# Patient Record
Sex: Female | Born: 1955 | Race: White | Hispanic: No | Smoking: Never smoker
Health system: Southern US, Community
[De-identification: ages and names within clinical notes are randomized; demographics above are authoritative.]

## PROBLEM LIST (undated history)

## (undated) DIAGNOSIS — R7303 Prediabetes: Secondary | ICD-10-CM

## (undated) DIAGNOSIS — T7840XA Allergy, unspecified, initial encounter: Secondary | ICD-10-CM

## (undated) DIAGNOSIS — E669 Obesity, unspecified: Secondary | ICD-10-CM

## (undated) DIAGNOSIS — Z923 Personal history of irradiation: Secondary | ICD-10-CM

## (undated) DIAGNOSIS — R519 Headache, unspecified: Secondary | ICD-10-CM

## (undated) HISTORY — DX: Allergy, unspecified, initial encounter: T78.40XA

## (undated) HISTORY — DX: Personal history of irradiation: Z92.3

## (undated) HISTORY — PX: LYMPH NODE BIOPSY: SHX201

## (undated) HISTORY — PX: COLON SURGERY: SHX602

## (undated) HISTORY — PX: FRACTURE SURGERY: SHX138

## (undated) HISTORY — DX: Obesity, unspecified: E66.9

## (undated) HISTORY — PX: TONSILLECTOMY: SUR1361

## (undated) HISTORY — PX: BUNIONECTOMY: SHX129

---

## 2000-02-12 ENCOUNTER — Encounter: Payer: Self-pay | Admitting: Family Medicine

## 2000-02-12 ENCOUNTER — Ambulatory Visit (HOSPITAL_COMMUNITY): Admission: RE | Admit: 2000-02-12 | Discharge: 2000-02-12 | Payer: Self-pay | Admitting: Family Medicine

## 2000-07-11 ENCOUNTER — Encounter: Payer: Self-pay | Admitting: Otolaryngology

## 2000-07-11 ENCOUNTER — Encounter: Admission: RE | Admit: 2000-07-11 | Discharge: 2000-07-11 | Payer: Self-pay | Admitting: Otolaryngology

## 2001-01-31 ENCOUNTER — Other Ambulatory Visit: Admission: RE | Admit: 2001-01-31 | Discharge: 2001-01-31 | Payer: Self-pay | Admitting: Obstetrics and Gynecology

## 2004-07-25 ENCOUNTER — Ambulatory Visit (HOSPITAL_COMMUNITY): Admission: RE | Admit: 2004-07-25 | Discharge: 2004-07-25 | Payer: Self-pay | Admitting: Family Medicine

## 2013-05-22 ENCOUNTER — Encounter: Payer: Self-pay | Admitting: Family Medicine

## 2013-05-22 ENCOUNTER — Ambulatory Visit (INDEPENDENT_AMBULATORY_CARE_PROVIDER_SITE_OTHER): Payer: 59 | Admitting: Family Medicine

## 2013-05-22 ENCOUNTER — Encounter (INDEPENDENT_AMBULATORY_CARE_PROVIDER_SITE_OTHER): Payer: Self-pay

## 2013-05-22 VITALS — BP 117/70 | HR 81 | Temp 98.2°F | Ht 65.0 in | Wt 188.0 lb

## 2013-05-22 DIAGNOSIS — M79609 Pain in unspecified limb: Secondary | ICD-10-CM

## 2013-05-22 DIAGNOSIS — B070 Plantar wart: Secondary | ICD-10-CM

## 2013-05-22 NOTE — Progress Notes (Signed)
   Subjective:    Patient ID: Haley Luna, female    DOB: 07-Apr-1956, 58 y.o.   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  Has had on present for past 1-2 months.  Progressively painful.  No fevers, chills, erythema    Review of Systems  All other systems reviewed and are negative.       Objective:   Physical Exam  Constitutional: She appears well-developed and well-nourished.  HENT:  Head: Normocephalic and atraumatic.  Eyes: Conjunctivae are normal. Pupils are equal, round, and reactive to light.  Neck: Normal range of motion. Neck supple.  Cardiovascular: Normal rate and regular rhythm.   Pulmonary/Chest: Effort normal and breath sounds normal.  Abdominal: Soft.  Neurological: She is alert.  Skin:  + plantar at base of 3rd toe           Assessment & Plan:  Plantar wart of right foot  Liquid nitrogen therapy to affected area.  Discussed general healing course and red flags.  Pt expressed understanding.  Follow up as needed.

## 2013-05-22 NOTE — Patient Instructions (Signed)
Plantar Wart Warts are benign (noncancerous) growths of the outer skin layer. They can occur at any time in life but are most common during childhood and the teen years. Warts can occur on many skin surfaces of the body. When they occur on the underside (sole) of your foot they are called plantar warts. They often emerge in groups with several small warts encircling a larger growth. CAUSES  Human papillomavirus (HPV) is the cause of plantar warts. HPV attacks a break in the skin of the foot. Walking barefoot can lead to exposure to the wart virus. Plantar warts tend to develop over areas of pressure such as the heel and ball of the foot. Plantar warts often grow into the deeper layers of skin. They may spread to other areas of the sole but cannot spread to other areas of the body. SYMPTOMS  You may also notice a growth on the undersurface of your foot. The wart may grow directly into the sole of the foot, or rise above the surface of the skin on the sole of the foot, or both. They are most often flat from pressure. Warts generally do not cause itching but may cause pain in the area of the wart when you put weight on your foot. DIAGNOSIS  Diagnosis is made by physical examination. This means your caregiver discovers it while examining your foot.  TREATMENT  There are many ways to treat plantar warts. However, warts are very tough. Sometimes it is difficult to treat them so that they go away completely and do not grow back. Any treatment must be done regularly to work. If left untreated, most plantar warts will eventually disappear over a period of one to two years. Treatments you can do at home include:  Putting duct tape over the top of the wart (occlusion), has been found to be effective over several months. The duct tape should be removed each night and reapplied until the wart has disappeared.  Placing over-the-counter medications on top of the wart to help kill the wart virus and remove the wart  tissue (salicylic acid, cantharidin, and dichloroacetic acid ) are useful. These are called keratolytic agents. These medications make the skin soft and gradually layers will shed away. Theses compounds are usually placed on the wart each night and then covered with a band-aid. They are also available in pre-medicated band-aid form. Avoid surrounding skin when applying these liquids as these medications can burn healthy skin. The treatment may take several months of nightly use to be effective.  Cryotherapy to freeze the wart has recently become available over-the-counter for children 4 years and older. This system makes use of a soft narrow applicator connected to a bottle of compressed cold liquid that is applied directly to the wart. This medication can burn health skin and should be used with caution.  As with all over-the-counter medications, read the directions carefully before use. Treatments generally done in your caregiver's office include:  Some aggressive treatments may cause discomfort, discoloration and scaring of the surrounding skin. The risks and benefits of treatment should be discussed with your caregiver.  Freezing the wart with liquid nitrogen (cryotherapy, see above).  Burning the wart with use of very high heat (cautery).  Injecting medication into the wart.  Surgically removing or laser treatment of the wart.  Your caregiver may refer you to a dermatologist for difficult to treat, large sized or large numbers of warts. HOME CARE INSTRUCTIONS   Soak the affected area in warm water. Dry   the area completely when you are done. Remove the top layer of softened skin, then apply the chosen topical medication and reapply a bandage.  Remove the bandage daily and file excess wart tissue (pumice stone works well for this purpose). Repeat the entire process daily or every other day for weeks until the plantar wart disappears.  Several brands of salicylic acid pads are available as  over-the-counter remedies.  Pain can be relieved by wearing a doughnut bandage. This is a bandage with a hole in it. The bandage is put on with the hole over the wart. This helps take the pressure off the wart and gives pain relief. To help prevent plantar warts:  Wear shoes and socks and change them daily.  Keep feet clean and dry.  Check your feet and your children's feet regularly.  Avoid direct contact with warts on other people.  Have growths, or changes on your skin checked by your caregiver. Document Released: 06/16/2003 Document Revised: 06/18/2011 Document Reviewed: 11/24/2008 ExitCare Patient Information 2014 [employer].  

## 2014-02-10 ENCOUNTER — Ambulatory Visit (INDEPENDENT_AMBULATORY_CARE_PROVIDER_SITE_OTHER): Payer: 59

## 2014-02-10 DIAGNOSIS — Z23 Encounter for immunization: Secondary | ICD-10-CM

## 2014-05-12 ENCOUNTER — Ambulatory Visit (INDEPENDENT_AMBULATORY_CARE_PROVIDER_SITE_OTHER): Payer: 59 | Admitting: Family

## 2014-05-12 ENCOUNTER — Encounter (INDEPENDENT_AMBULATORY_CARE_PROVIDER_SITE_OTHER): Payer: Self-pay

## 2014-05-12 ENCOUNTER — Encounter: Payer: Self-pay | Admitting: Family

## 2014-05-12 VITALS — BP 127/84 | HR 79 | Temp 97.3°F | Ht 65.0 in | Wt 191.2 lb

## 2014-05-12 DIAGNOSIS — B070 Plantar wart: Secondary | ICD-10-CM

## 2014-05-12 NOTE — Patient Instructions (Signed)
Plantar Warts Warts are benign (noncancerous) growths of the outer skin layer. They can occur at any time in life but are most common during childhood and the teen years. Warts can occur on many skin surfaces of the body. When they occur on the underside (sole) of your foot they are called plantar warts. They often emerge in groups with several small warts encircling a larger growth. CAUSES  Human papillomavirus (HPV) is the cause of plantar warts. HPV attacks a break in the skin of the foot. Walking barefoot can lead to exposure to the wart virus. Plantar warts tend to develop over areas of pressure such as the heel and ball of the foot. Plantar warts often grow into the deeper layers of skin. They may spread to other areas of the sole but cannot spread to other areas of the body. SYMPTOMS  You may also notice a growth on the undersurface of your foot. The wart may grow directly into the sole of the foot, or rise above the surface of the skin on the sole of the foot, or both. They are most often flat from pressure. Warts generally do not cause itching but may cause pain in the area of the wart when you put weight on your foot. DIAGNOSIS  Diagnosis is made by physical examination. This means your caregiver discovers it while examining your foot.  TREATMENT  There are many ways to treat plantar warts. However, warts are very tough. Sometimes it is difficult to treat them so that they go away completely and do not grow back. Any treatment must be done regularly to work. If left untreated, most plantar warts will eventually disappear over a period of one to two years. Treatments you can do at home include:  Putting duct tape over the top of the wart (occlusion) has been found to be effective over several months. The duct tape should be removed each night and reapplied until the wart has disappeared.  Placing over-the-counter medications on top of the wart to help kill the wart virus and remove the wart  tissue (salicylic acid, cantharidin, and dichloroacetic acid) are useful. These are called keratolytic agents. These medications make the skin soft and gradually layers will shed away. These compounds are usually placed on the wart each night and then covered with a bandage. They are also available in premedicated bandage form. Avoid surrounding skin when applying these liquids as these medications can burn healthy skin. The treatment may take several months of nightly use to be effective.  Cryotherapy to freeze the wart has recently become available over-the-counter for children 4 years and older. This system makes use of a soft narrow applicator connected to a bottle of compressed cold liquid that is applied directly to the wart. This medication can burn healthy skin and should be used with caution.  As with all over-the-counter medications, read the directions carefully before use. Treatments generally done in your caregiver's office include:  Some aggressive treatments may cause discomfort, discoloration, and scarring of the surrounding skin. The risks and benefits of treatment should be discussed with your caregiver.  Freezing the wart with liquid nitrogen (cryotherapy, see above).  Burning the wart with use of very high heat (cautery).  Injecting medication into the wart.  Surgically removing or laser treatment of the wart.  Your caregiver may refer you to a dermatologist for difficult to treat large-sized warts or large numbers of warts. HOME CARE INSTRUCTIONS   Soak the affected area in warm water. Dry the   area completely when you are done. Remove the top layer of softened skin, then apply the chosen topical medication and reapply a bandage.  Remove the bandage daily and file excess wart tissue (pumice stone works well for this purpose). Repeat the entire process daily or every other day for weeks until the plantar wart disappears.  Several brands of salicylic acid pads are available  as over-the-counter remedies.  Pain can be relieved by wearing a donut bandage. This is a bandage with a hole in it. The bandage is put on with the hole over the wart. This helps take the pressure off the wart and gives pain relief. To help prevent plantar warts:  Wear shoes and socks and change them daily.  Keep feet clean and dry.  Check your feet and your children's feet regularly.  Avoid direct contact with warts on other people.  Have growths or changes on your skin checked by your caregiver. Document Released: 06/16/2003 Document Revised: 08/10/2013 Document Reviewed: 11/24/2008 ExitCare Patient Information 2015 [employer]. This information is not intended to replace advice given to you by your health care provider. Make sure you discuss any questions you have with your health care provider.  

## 2014-05-12 NOTE — Progress Notes (Signed)
   Subjective:    Patient ID: Haley Luna, female    DOB: 30-Jan-1956, 59 y.o.   MRN: [id number]  HPI Pt presents to the office for a a wart on her right ball of her foot.  She states it is has been there for about a month now. Pt states it is painful to walk. Pt states she has not tired anything. Pt states she has had one in the past and had it removed in the office.    Review of Systems  Constitutional: Negative.   HENT: Negative.   Eyes: Negative.   Respiratory: Negative.  Negative for shortness of breath.   Cardiovascular: Negative.  Negative for palpitations.  Gastrointestinal: Negative.   Endocrine: Negative.   Genitourinary: Negative.   Musculoskeletal: Negative.   Neurological: Negative.  Negative for headaches.  Hematological: Negative.   Psychiatric/Behavioral: Negative.   All other systems reviewed and are negative.      Objective:   Physical Exam  Constitutional: She is oriented to person, place, and time. She appears well-developed and well-nourished. No distress.  Eyes: Pupils are equal, round, and reactive to light.  Neck: Normal range of motion. Neck supple. No thyromegaly present.  Cardiovascular: Normal rate, regular rhythm, normal heart sounds and intact distal pulses.   No murmur heard. Pulmonary/Chest: Effort normal and breath sounds normal. No respiratory distress. She has no wheezes.  Abdominal: Soft. Bowel sounds are normal. She exhibits no distension. There is no tenderness.  Musculoskeletal: Normal range of motion. She exhibits no edema or tenderness.  Neurological: She is alert and oriented to person, place, and time.  Skin: Skin is warm and dry.  Psychiatric: She has a normal mood and affect. Her behavior is normal. Judgment and thought content normal.  Vitals reviewed.   Ht 5\' 5"  (1.651 m)  Wt 191 lb 3.2 oz (86.728 kg)  BMI 31.82 kg/m2   Liquid nitrogen applied to the base of pt's foot.     Assessment & Plan:  1. Plantar wart, right  foot -Discussed that a blister will form  Do not pick at blister - Discussed s/s of infection -RTO prn  Jannifer Rodneyhristy Itzae Mccurdy, FNP

## 2015-04-10 DIAGNOSIS — J189 Pneumonia, unspecified organism: Secondary | ICD-10-CM

## 2015-04-10 HISTORY — DX: Pneumonia, unspecified organism: J18.9

## 2015-05-10 LAB — HM MAMMOGRAPHY: HM Mammogram: NEGATIVE

## 2015-05-13 ENCOUNTER — Encounter: Payer: Self-pay | Admitting: *Deleted

## 2015-05-20 ENCOUNTER — Telehealth: Payer: Self-pay | Admitting: Family

## 2015-05-30 NOTE — Telephone Encounter (Signed)
Pt had on bus

## 2016-09-14 ENCOUNTER — Encounter: Payer: Self-pay | Admitting: Physician Assistant

## 2016-09-14 ENCOUNTER — Ambulatory Visit (INDEPENDENT_AMBULATORY_CARE_PROVIDER_SITE_OTHER): Payer: Managed Care, Other (non HMO) | Admitting: Physician Assistant

## 2016-09-14 VITALS — BP 125/82 | HR 76 | Temp 97.3°F | Ht 65.0 in | Wt 194.2 lb

## 2016-09-14 DIAGNOSIS — H109 Unspecified conjunctivitis: Secondary | ICD-10-CM | POA: Diagnosis not present

## 2016-09-14 MED ORDER — TOBRAMYCIN 0.3 % OP SOLN: 2.0000 [drp] | Freq: Four times a day (QID) | OPHTHALMIC | 0 refills | Status: DC

## 2016-09-14 NOTE — Progress Notes (Signed)
Subjective:     Patient ID: Haley Luna, female   DOB: January 08, 1956, 61 y.o.   MRN: [id number]  HPI Watering, irritation and matting to the R eye x 2 days Grandchild with conjunctivitis   Review of Systems Denies pain or change in vision to the eye    Objective:   Physical Exam No lid edema seen PERRLA EOMI + matting to the lashes of the R eye Sl conj erythema No pre-aur nodes    Assessment:     Conjunctivitis of right eye, unspecified conjunctivitis type      Plan:     Freq handwashing Tobrex rx Nl course reviewed F/U prn

## 2016-09-14 NOTE — Patient Instructions (Signed)

## 2016-11-05 ENCOUNTER — Encounter: Payer: 59 | Admitting: *Deleted

## 2017-06-01 ENCOUNTER — Ambulatory Visit: Payer: BLUE CROSS/BLUE SHIELD | Admitting: Physician Assistant

## 2017-06-01 ENCOUNTER — Encounter: Payer: Self-pay | Admitting: Physician Assistant

## 2017-06-01 VITALS — BP 129/85 | HR 88 | Temp 100.4°F | Resp 20 | Ht 65.0 in | Wt 190.0 lb

## 2017-06-01 DIAGNOSIS — R6889 Other general symptoms and signs: Secondary | ICD-10-CM | POA: Diagnosis not present

## 2017-06-01 DIAGNOSIS — J111 Influenza due to unidentified influenza virus with other respiratory manifestations: Secondary | ICD-10-CM

## 2017-06-01 MED ORDER — OSELTAMIVIR PHOSPHATE 75 MG PO CAPS: 75.0000 mg | ORAL_CAPSULE | Freq: Two times a day (BID) | ORAL | 0 refills | Status: DC

## 2017-06-03 LAB — VERITOR FLU A/B WAIVED
Influenza A: NEGATIVE
Influenza B: NEGATIVE

## 2017-06-03 NOTE — Progress Notes (Signed)
BP 129/85   Pulse 88   Temp (!) 100.4 F (38 C) (Oral)   Resp 20   Ht 5\' 5"  (1.651 m)   Wt 190 lb (86.2 kg)   SpO2 98%   BMI 31.62 kg/m    Subjective:    Patient ID: Haley Luna, female    DOB: 1955/12/10, 62 y.o.   MRN: [id number]  HPI: Haley Luna is a 62 y.o. female presenting on 06/01/2017 for Cough; Sore Throat; Fever; and Nasal Congestion  This patient has had less than 2 days severe fever, chills, myalgias.  Complains of sinus headache and postnasal drainage. There is copious drainage at times. Associated sore throat, decreased appetite and headache.  Has been exposed to influenza.  She has been taking care of grandchildren with influenza and daughter has had surgery.  History reviewed. No pertinent past medical history. Relevant past medical, surgical, family and social history reviewed and updated as indicated. Interim medical history since our last visit reviewed. Allergies and medications reviewed and updated. DATA REVIEWED: CHART IN EPIC  Family History reviewed for pertinent findings.  Review of Systems  Constitutional: Positive for appetite change, chills, fatigue and fever. Negative for activity change.  HENT: Positive for congestion, postnasal drip and sore throat.   Eyes: Negative.   Respiratory: Negative for cough and wheezing.   Cardiovascular: Negative.  Negative for chest pain, palpitations and leg swelling.  Gastrointestinal: Negative.   Genitourinary: Negative.   Musculoskeletal: Positive for myalgias.  Skin: Negative.   Neurological: Positive for headaches.    Allergies as of 06/01/2017      Reactions   Penicillins    Reaction as a child. Told she almost died.   Erythromycin Nausea And Vomiting, Rash   Declomycin [demeclocycline] Rash      Medication List        Accurate as of 06/01/17 11:59 PM. Always use your most recent med list.          co-enzyme Q-10 30 MG capsule Take 30 mg by mouth 3 (three) times daily.   FISH  OIL PO Take by mouth.   oseltamivir 75 MG capsule Commonly known as:  TAMIFLU Take 1 capsule (75 mg total) by mouth 2 (two) times daily.          Objective:    BP 129/85   Pulse 88   Temp (!) 100.4 F (38 C) (Oral)   Resp 20   Ht 5\' 5"  (1.651 m)   Wt 190 lb (86.2 kg)   SpO2 98%   BMI 31.62 kg/m   Allergies  Allergen Reactions  . Penicillins     Reaction as a child. Told she almost died.  . Erythromycin Nausea And Vomiting and Rash  . Declomycin [Demeclocycline] Rash    Wt Readings from Last 3 Encounters:  06/01/17 190 lb (86.2 kg)  09/14/16 194 lb 3.2 oz (88.1 kg)  05/12/14 191 lb 3.2 oz (86.7 kg)    Physical Exam  Constitutional: She is oriented to person, place, and time. She appears well-developed and well-nourished. No distress.  HENT:  Head: Normocephalic and atraumatic.  Right Ear: Tympanic membrane normal.  Left Ear: Tympanic membrane normal.  Nose: Mucosal edema and rhinorrhea present. Right sinus exhibits no frontal sinus tenderness. Left sinus exhibits no frontal sinus tenderness.  Mouth/Throat: Posterior oropharyngeal erythema present. No oropharyngeal exudate or tonsillar abscesses.  Eyes: Conjunctivae and EOM are normal. Pupils are equal, round, and reactive to light.  Neck: Normal range of  motion.  Cardiovascular: Normal rate, regular rhythm, normal heart sounds, intact distal pulses and normal pulses.  Pulmonary/Chest: Effort normal and breath sounds normal. No respiratory distress.  Abdominal: Soft. Bowel sounds are normal.  Neurological: She is alert and oriented to person, place, and time. She has normal reflexes.  Skin: Skin is warm and dry. No rash noted.  Psychiatric: She has a normal mood and affect. Her behavior is normal. Judgment and thought content normal.  Nursing note and vitals reviewed.   Results for orders placed or performed in visit on 06/01/17  Veritor Flu A/B Waived  Result Value Ref Range   Influenza A Negative Negative    Influenza B Negative Negative      Assessment & Plan:   1. Flu-like symptoms - Veritor Flu A/B Waived - oseltamivir (TAMIFLU) 75 MG capsule; Take 1 capsule (75 mg total) by mouth 2 (two) times daily.  Dispense: 10 capsule; Refill: 0  2. Influenza - oseltamivir (TAMIFLU) 75 MG capsule; Take 1 capsule (75 mg total) by mouth 2 (two) times daily.  Dispense: 10 capsule; Refill: 0   Continue all other maintenance medications as listed above.  Follow up plan: No Follow-up on file.  Educational handout given for survey  Remus LofflerAngel S. Shalona Harbour PA-C [facility] [address]  [place], [place] [address] [phone]   06/03/2017, 10:44 PM

## 2017-10-29 ENCOUNTER — Ambulatory Visit (INDEPENDENT_AMBULATORY_CARE_PROVIDER_SITE_OTHER): Payer: 59

## 2017-10-29 ENCOUNTER — Ambulatory Visit: Payer: 59 | Admitting: Family Medicine

## 2017-10-29 VITALS — BP 121/87 | HR 71 | Temp 97.4°F | Ht 65.0 in | Wt 190.0 lb

## 2017-10-29 DIAGNOSIS — W19XXXA Unspecified fall, initial encounter: Secondary | ICD-10-CM

## 2017-10-29 DIAGNOSIS — S92351A Displaced fracture of fifth metatarsal bone, right foot, initial encounter for closed fracture: Secondary | ICD-10-CM | POA: Diagnosis not present

## 2017-10-29 DIAGNOSIS — M79671 Pain in right foot: Secondary | ICD-10-CM | POA: Diagnosis not present

## 2017-10-29 NOTE — Patient Instructions (Signed)
Metatarsal Fracture A metatarsal fracture is a broken bone in one of the five bones that connect your toes to the rest of your foot (forefoot fracture). Metatarsals are long bones that can be stressed or cracked easily. A metatarsal fracture can be:  A stress fracture. Stress fractures are cracks in the surface of the metatarsal bone. Athletes often get stress fractures.  A complete fracture. A complete fracture goes all the way through the bone.  The bone that connects to the pinky toe (fifth metatarsal) is the most commonly fractured metatarsal. Ballet dancers often fracture this bone. What are the causes? This type of fracture may be caused by:  A sudden twisting of your foot.  A fall onto your foot.  Overuse or repetitive exercise.  What increases the risk? This condition is more likely to develop in people who:  Play contact sports.  Have a bone disease.  Have a low calcium level.  What are the signs or symptoms? Symptoms of this condition include:  Pain that is worse when walking or standing.  Pain when pressing on the foot or moving the toes.  Swelling.  Bruising on the top or bottom of the foot.  A foot that appears shorter than the other one.  How is this diagnosed? This condition is diagnosed with a physical exam. You may also have imaging tests, such as:  X-rays.  A CT scan.  MRI.  How is this treated? Treatment for this condition depends on its severity and whether a bone has moved out of place. Treatment may involve:  Rest.  Wearing foot support such as a cast, splint, or boot for several weeks.  Using crutches.  Surgery to move bones back into the right position. Surgery is usually needed if there are many pieces of broken bone or bones that are very out of place (displaced fracture).  Physical therapy. This may be needed to help you regain full movement and strength in your foot.  You will need to return to your health care provider to have  X-rays taken until your bones heal. Your health care provider will look at the X-rays to make sure that your foot is healing well. Follow these instructions at home: If you have a cast:  Do not stick anything inside the cast to scratch your skin. Doing that increases your risk of infection.  Check the skin around the cast every day. Report any concerns to your health care provider. You may put lotion on dry skin around the edges of the cast. Do not apply lotion to the skin underneath the cast.  Keep the cast clean and dry. If you have a splint or a supportive boot:  Wear it as directed by your health care provider. Remove it only as directed by your health care provider.  Loosen it if your toes become numb and tingle, or if they turn cold and blue.  Keep it clean and dry. Bathing  Do not take baths, swim, or use a hot tub until your health care provider approves. Ask your health care provider if you can take showers. You may only be allowed to take sponge baths for bathing.  If your health care provider approves bathing and showering, cover the cast or splint with a watertight plastic bag to protect it from water. Do not let the cast or splint get wet. Managing pain, stiffness, and swelling  If directed, apply ice to the injured area (if you have a splint, not a cast). ? Put   ice in a plastic bag. ? Place a towel between your skin and the bag. ? Leave the ice on for 20 minutes, 2-3 times per day.  Move your toes often to avoid stiffness and to lessen swelling.  Raise (elevate) the injured area above the level of your heart while you are sitting or lying down. Driving  Do not drive or operate heavy machinery while taking pain medicine.  Do not drive while wearing foot support on a foot that you use for driving. Activity  Return to your normal activities as directed by your health care provider. Ask your health care provider what activities are safe for you.  Perform exercises as  directed by your health care provider or physical therapist. Safety  Do not use the injured foot to support your body weight until your health care provider says that you can. Use crutches as directed by your health care provider. General instructions  Do not put pressure on any part of the cast or splint until it is fully hardened. This may take several hours.  Do not use any tobacco products, including cigarettes, chewing tobacco, or e-cigarettes. Tobacco can delay bone healing. If you need help quitting, ask your health care provider.  Take medicines only as directed by your health care provider.  Keep all follow-up visits as directed by your health care provider. This is important. Contact a health care provider if:  You have a fever.  Your cast, splint, or boot is too loose or too tight.  Your cast, splint, or boot is damaged.  Your pain medicine is not helping.  You have pain, tingling, or numbness in your foot that is not going away. Get help right away if:  You have severe pain.  You have tingling or numbness in your foot that is getting worse.  Your foot feels cold or becomes numb.  Your foot changes color. This information is not intended to replace advice given to you by your health care provider. Make sure you discuss any questions you have with your health care provider. Document Released: 12/16/2001 Document Revised: 11/29/2015 Document Reviewed: 01/20/2014 Else[employer] Interactive Patient Education  2018 [employer]  

## 2017-10-29 NOTE — Progress Notes (Signed)
Subjective: CC: foot pain PCP: Junie SpencerHawks, Christy A, FNP ZOX:WRUEAVWHPI:Karess Leone PayorS Haley Luna is a 62 y.o. female presenting to clinic today for:  1. Foot pain Patient reports that she rolled her right foot about 9 days ago.  She reports the pain and swelling has gotten somewhat better since that time she has persistent pain along the lateral aspect of the right foot.  She still has some persistent swelling over that area as well.  She has been predominantly walking on her heel, as she cites that when she walks on the full foot pain is worsened.  She reports a history of fracture in the left foot that felt very similar and therefore she came for evaluation today.  No numbness or tingling.  No persistent discoloration.  She has been using ibuprofen with good relief of discomfort.  If she has a see orthopedic she prefers to see someone in [place].   ROS: Per HPI  Allergies  Allergen Reactions  . Penicillins     Reaction as a child. Told she almost died.  . Erythromycin Nausea And Vomiting and Rash  . Declomycin [Demeclocycline] Rash   No past medical history on file.  Current Outpatient Medications:  .  co-enzyme Q-10 30 MG capsule, Take 30 mg by mouth 3 (three) times daily., Disp: , Rfl:  .  Omega-3 Fatty Acids (FISH OIL PO), Take by mouth., Disp: , Rfl:    Social History   Socioeconomic History  . Marital status: Married    Spouse name: Not on file  . Number of children: Not on file  . Years of education: Not on file  . Highest education level: Not on file  Occupational History  . Not on file  Social Needs  . Financial resource strain: Not on file  . Food insecurity:    Worry: Not on file    Inability: Not on file  . Transportation needs:    Medical: Not on file    Non-medical: Not on file  Tobacco Use  . Smoking status: Never Smoker  . Smokeless tobacco: Never Used  Substance and Sexual Activity  . Alcohol use: No  . Drug use: No  . Sexual activity: Not on file  Lifestyle  .  Physical activity:    Days per week: Not on file    Minutes per session: Not on file  . Stress: Not on file  Relationships  . Social connections:    Talks on phone: Not on file    Gets together: Not on file    Attends religious service: Not on file    Active member of club or organization: Not on file    Attends meetings of clubs or organizations: Not on file    Relationship status: Not on file  . Intimate partner violence:    Fear of current or ex partner: Not on file    Emotionally abused: Not on file    Physically abused: Not on file    Forced sexual activity: Not on file  Other Topics Concern  . Not on file  Social History Narrative  . Not on file   Family History  Problem Relation Age of Onset  . Heart disease Mother   . Heart attack Mother     Objective: Office vital signs reviewed. BP 121/87   Pulse 71   Temp (!) 97.4 F (36.3 C) (Oral)   Ht 5\' 5"  (1.651 m)   Wt 190 lb (86.2 kg)   BMI 31.62 kg/m  Physical Examination:  General: Awake, alert, well nourished, No acute distress Extremities: warm, well perfused, trace soft tissue swelling appreciated along the dorsolateral aspect of the right foot just under the ATFL. No cyanosis or clubbing; +2 pulses bilaterally MSK: antalgic gait and station; unable to put weight on midfoot.  Right foot: Soft tissue swelling noted as above.  No significant erythema or ecchymosis noted.  Has full painless active range of motion in all planes.  With resistance, she does have pain with dorsiflexion and abduction.  No tenderness to palpation over the ATFL.  She does have tenderness to palpation along the base of the fifth metatarsal. Skin: dry; intact; no rashes or lesions Neuro: light touch sensation grossly intact  Dg Foot Complete Right  Result Date: 10/29/2017 CLINICAL DATA:  Right foot injury.  Pain base of fifth metatarsal. EXAM: RIGHT FOOT COMPLETE - 3+ VIEW COMPARISON:  No prior. FINDINGS: Slight displaced fracture of the  base of the right fifth metatarsal noted. Diffuse degenerative change. Degenerative changes most prominent about the first MTP joint. IMPRESSION: Slight displaced fracture of the base of the right fifth metatarsal. Electronically Signed   By: Maisie Fus  Register   On: 10/29/2017 08:47     Assessment/ Plan: 62 y.o. female   1. Foot pain, right Physical exam notable for tenderness out of proportion to exam along the base of the fifth metatarsal.  X-ray was obtained and personal review demonstrated a fracture within the fifth metatarsal base.  Radiologist review confirms this.  Patient placed in a walking shoe and referred to orthopedics.  Home care instructions were reviewed with the patient.  Okay to continue oral NSAID, ice and elevation of right lower extremity. - DG Foot Complete Right; Future - Ambulatory referral to Orthopedic Surgery  2. Closed fracture of base of fifth metatarsal bone of right foot, initial encounter - Ambulatory referral to Orthopedic Surgery    Orders Placed This Encounter  Procedures  . DG Foot Complete Right    Standing Status:   Future    Number of Occurrences:   1    Standing Expiration Date:   12/31/2018    Order Specific Question:   Reason for Exam (SYMPTOM  OR DIAGNOSIS REQUIRED)    Answer:   rolled ankle    Order Specific Question:   Is patient pregnant?    Answer:   No    Order Specific Question:   Preferred imaging location?    Answer:   Internal    Order Specific Question:   Radiology Contrast Protocol - do NOT remove file path    Answer:   [email or web address]   No orders of the defined types were placed in this encounter.    Raliegh Ip, DO [facility] [phone]

## 2017-11-13 ENCOUNTER — Other Ambulatory Visit: Payer: Self-pay | Admitting: Family

## 2017-11-13 ENCOUNTER — Other Ambulatory Visit (INDEPENDENT_AMBULATORY_CARE_PROVIDER_SITE_OTHER): Payer: 59

## 2017-11-13 DIAGNOSIS — Z09 Encounter for follow-up examination after completed treatment for conditions other than malignant neoplasm: Secondary | ICD-10-CM

## 2017-11-13 DIAGNOSIS — M79671 Pain in right foot: Secondary | ICD-10-CM | POA: Diagnosis not present

## 2017-11-28 ENCOUNTER — Other Ambulatory Visit (INDEPENDENT_AMBULATORY_CARE_PROVIDER_SITE_OTHER): Payer: 59

## 2017-11-28 ENCOUNTER — Other Ambulatory Visit: Payer: Self-pay | Admitting: Orthopedic Surgery

## 2017-11-28 DIAGNOSIS — R52 Pain, unspecified: Secondary | ICD-10-CM

## 2017-11-28 DIAGNOSIS — M79671 Pain in right foot: Secondary | ICD-10-CM

## 2017-12-20 ENCOUNTER — Telehealth: Payer: Self-pay | Admitting: Family

## 2017-12-20 ENCOUNTER — Other Ambulatory Visit: Payer: Self-pay | Admitting: Orthopedic Surgery

## 2017-12-20 DIAGNOSIS — T148XXA Other injury of unspecified body region, initial encounter: Secondary | ICD-10-CM

## 2017-12-20 NOTE — Telephone Encounter (Signed)
Spoke with pt to verify receipt of order and scheduled her for 12/25/17 at 11am

## 2017-12-25 ENCOUNTER — Other Ambulatory Visit: Payer: Self-pay

## 2017-12-25 ENCOUNTER — Other Ambulatory Visit (INDEPENDENT_AMBULATORY_CARE_PROVIDER_SITE_OTHER): Payer: 59

## 2017-12-25 DIAGNOSIS — M79671 Pain in right foot: Secondary | ICD-10-CM | POA: Diagnosis not present

## 2017-12-25 DIAGNOSIS — T148XXA Other injury of unspecified body region, initial encounter: Secondary | ICD-10-CM

## 2018-02-18 LAB — HM MAMMOGRAPHY

## 2018-04-09 DIAGNOSIS — R7303 Prediabetes: Secondary | ICD-10-CM | POA: Insufficient documentation

## 2018-04-09 HISTORY — DX: Prediabetes: R73.03

## 2018-09-08 ENCOUNTER — Encounter (HOSPITAL_COMMUNITY): Payer: Self-pay | Admitting: Emergency Medicine

## 2018-09-08 ENCOUNTER — Emergency Department (HOSPITAL_COMMUNITY): Payer: No Typology Code available for payment source

## 2018-09-08 ENCOUNTER — Observation Stay (HOSPITAL_COMMUNITY)
Admission: EM | Admit: 2018-09-08 | Discharge: 2018-09-09 | Disposition: A | Discharge status: Home / Self Care | Payer: No Typology Code available for payment source | Attending: Cardiology | Admitting: Cardiology

## 2018-09-08 ENCOUNTER — Other Ambulatory Visit: Payer: Self-pay

## 2018-09-08 DIAGNOSIS — Z79899 Other long term (current) drug therapy: Secondary | ICD-10-CM | POA: Diagnosis not present

## 2018-09-08 DIAGNOSIS — R072 Precordial pain: Secondary | ICD-10-CM

## 2018-09-08 DIAGNOSIS — I209 Angina pectoris, unspecified: Secondary | ICD-10-CM | POA: Diagnosis not present

## 2018-09-08 DIAGNOSIS — Z20828 Contact with and (suspected) exposure to other viral communicable diseases: Secondary | ICD-10-CM | POA: Diagnosis not present

## 2018-09-08 DIAGNOSIS — E785 Hyperlipidemia, unspecified: Secondary | ICD-10-CM

## 2018-09-08 DIAGNOSIS — R079 Chest pain, unspecified: Secondary | ICD-10-CM | POA: Diagnosis present

## 2018-09-08 LAB — BASIC METABOLIC PANEL
Anion gap: 13 (ref 5–15)
BUN: 17 mg/dL (ref 8–23)
CO2: 22 mmol/L (ref 22–32)
Calcium: 9.1 mg/dL (ref 8.9–10.3)
Chloride: 103 mmol/L (ref 98–111)
Creatinine, Ser: 0.9 mg/dL (ref 0.44–1.00)
GFR calc Af Amer: >60 mL/min (ref >60)
GFR calc non Af Amer: >60 mL/min (ref >60)
Glucose, Bld: 128 mg/dL — ABNORMAL HIGH (ref 70–99)
Potassium: 4 mmol/L (ref 3.5–5.1)
Sodium: 138 mmol/L (ref 135–145)

## 2018-09-08 LAB — CBC
HCT: 43.1 % (ref 36.0–46.0)
Hemoglobin: 14.1 g/dL (ref 12.0–15.0)
MCH: 29.7 pg (ref 26.0–34.0)
MCHC: 32.7 g/dL (ref 30.0–36.0)
MCV: 90.7 fL (ref 80.0–100.0)
Platelets: 261 10*3/uL (ref 150–400)
RBC: 4.75 MIL/uL (ref 3.87–5.11)
RDW: 12.9 % (ref 11.5–15.5)
WBC: 6.6 10*3/uL (ref 4.0–10.5)
nRBC: 0 % (ref 0.0–0.2)

## 2018-09-08 LAB — CREATININE, SERUM
Creatinine, Ser: 0.89 mg/dL (ref 0.44–1.00)
GFR calc Af Amer: >60 mL/min (ref >60)
GFR calc non Af Amer: >60 mL/min (ref >60)

## 2018-09-08 LAB — SARS CORONAVIRUS 2 BY RT PCR (HOSPITAL ORDER, PERFORMED IN ~~LOC~~ HOSPITAL LAB): SARS Coronavirus 2: NEGATIVE

## 2018-09-08 LAB — TROPONIN I
Troponin I: <0.03 ng/mL (ref <0.03)
Troponin I: <0.03 ng/mL (ref <0.03)

## 2018-09-08 MED ORDER — SODIUM CHLORIDE 0.9% FLUSH: 3.0000 mL | Freq: Once | INTRAVENOUS | Status: DC

## 2018-09-08 MED ORDER — ASPIRIN 81 MG PO CHEW: 324.0000 mg | CHEWABLE_TABLET | Freq: Once | ORAL | Status: DC

## 2018-09-08 MED ORDER — ACETAMINOPHEN 325 MG PO TABS: 650.0000 mg | ORAL_TABLET | ORAL | Status: DC | PRN

## 2018-09-08 MED ORDER — ONDANSETRON HCL 4 MG/2ML IJ SOLN: 4.0000 mg | Freq: Four times a day (QID) | INTRAMUSCULAR | Status: DC | PRN

## 2018-09-08 MED ORDER — ASPIRIN EC 81 MG PO TBEC
81.0000 mg | DELAYED_RELEASE_TABLET | Freq: Every day | ORAL | Status: DC
  Administered 2018-09-09: 81 mg via ORAL
  Filled 2018-09-08: qty 1

## 2018-09-08 MED ORDER — ENOXAPARIN SODIUM 40 MG/0.4ML ~~LOC~~ SOLN
40.0000 mg | SUBCUTANEOUS | Status: DC
  Administered 2018-09-08: 40 mg via SUBCUTANEOUS
  Filled 2018-09-08: qty 0.4

## 2018-09-08 MED ORDER — NITROGLYCERIN 0.4 MG SL SUBL: 0.4000 mg | SUBLINGUAL_TABLET | SUBLINGUAL | Status: DC | PRN

## 2018-09-08 NOTE — ED Provider Notes (Signed)
[facility] EMERGENCY DEPARTMENT Provider Note   CSN: [id number] Arrival date & time: 09/08/18  1109    History   Chief Complaint Chief Complaint  Patient presents with  . Chest Pain    HPI Haley Luna is a 63 y.o. female.  HPI: A 63 year old patient with a history of obesity presents for evaluation of chest pain. Initial onset of pain was more than 6 hours ago. The patient's chest pain is described as heaviness/pressure/tightness, is sharp and is not worse with exertion. The patient complains of nausea. The patient's chest pain is middle- or left-sided, is not well-localized and does radiate to the arms/jaw/neck. The patient denies diaphoresis. The patient has no history of stroke, has no history of peripheral artery disease, has not smoked in the past 90 days, denies any history of treated diabetes, has no relevant family history of coronary artery disease (first degree relative at less than age 39), is not hypertensive and has no history of hypercholesterolemia.    HPI  History reviewed. No pertinent past medical history.  There are no active problems to display for this patient.   History reviewed. No pertinent surgical history.   OB History   No obstetric history on file.      Home Medications    Prior to Admission medications   Medication Sig Start Date End Date Taking? Authorizing Provider  co-enzyme Q-10 30 MG capsule Take 30 mg by mouth 3 (three) times daily.    [provider]  Omega-3 Fatty Acids (FISH OIL PO) Take by mouth.    [provider]    Family History Family History  Problem Relation Age of Onset  . Heart disease Mother   . Heart attack Mother     Social History Social History   Tobacco Use  . Smoking status: Never Smoker  . Smokeless tobacco: Never Used  Substance Use Topics  . Alcohol use: No  . Drug use: No     Allergies   Penicillins; Erythromycin; and Declomycin [demeclocycline]    Review of Systems Review of Systems  Constitutional: Positive for activity change.  Cardiovascular: Positive for chest pain.  Gastrointestinal: Positive for nausea.  Neurological: Positive for dizziness.  All other systems reviewed and are negative.   Physical Exam Updated Vital Signs BP (!) 142/81   Pulse 70   Resp 19   SpO2 98%   Physical Exam Vitals signs and nursing note reviewed.  Constitutional:      Appearance: She is well-developed.  HENT:     Head: Normocephalic and atraumatic.  Neck:     Musculoskeletal: Normal range of motion and neck supple.  Cardiovascular:     Rate and Rhythm: Normal rate.  Pulmonary:     Effort: Pulmonary effort is normal.  Abdominal:     General: Bowel sounds are normal.  Skin:    General: Skin is warm and dry.  Neurological:     Mental Status: She is alert and oriented to person, place, and time.      ED Treatments / Results  Labs (all labs ordered are listed, but only abnormal results are displayed) Labs Reviewed  BASIC METABOLIC PANEL - Abnormal; Notable for the following components:      Result Value   Glucose, Bld 128 (*)    All other components within normal limits  SARS CORONAVIRUS 2 (HOSPITAL ORDER, PERFORMED IN [facility])  CBC  TROPONIN I    EKG EKG Interpretation  Date/Time:  Monday September 08 2018 11:24:26 EDT Ventricular Rate:  62 PR Interval:    QRS Duration: 96 QT Interval:  404 QTC Calculation: 411 R Axis:   25 Text Interpretation:  Sinus rhythm Low voltage, precordial leads Borderline T abnormalities, anterior leads (flattening) No acute changes No old tracing to compare Reconfirmed by Derwood Kaplananavati, Neely Kammerer [phone]([id number]) on 09/08/2018 12:03:26 PM   Radiology Dg Chest 2 View  Result Date: 09/08/2018 CLINICAL DATA:  Chest pain EXAM: CHEST - 2 VIEW COMPARISON:  None. FINDINGS: Cardiac shadows within normal limits. The lungs are well aerated bilaterally. No focal infiltrate or sizable effusion is seen. No  acute bony abnormality is noted. IMPRESSION: No active cardiopulmonary disease. Electronically Signed   By: Alcide CleverMark  Lukens M.D.   On: 09/08/2018 11:51    Procedures Procedures (including critical care time)  Medications Ordered in ED Medications  sodium chloride flush (NS) 0.9 % injection 3 mL (has no administration in time range)  aspirin chewable tablet 324 mg (324 mg Oral Not Given 09/08/18 1228)     Initial Impression / Assessment and Plan / ED Course  I have reviewed the triage vital signs and the nursing notes.  Pertinent labs & imaging results that were available during my care of the patient were reviewed by me and considered in my medical decision making (see chart for details).     HEAR Score: 824  63 year old female comes in with chief complaint of chest pain.  She does not have any significant medical history.  She woke up this morning and started having midsternal chest pain that was radiating to her neck and she continues to have " funny" sensation in her left upper extremity.  Suspicion is high for the pain to be primarily cardiac in nature.  Initial EKG showed T wave inversions at the urgent care, therefore they advised her to come to the ER.  Patient continues to be chest pain-free, and she is noted to have T wave flattening in the anterior leads here.  Hear score is 4. Cardiology team will assess the patient and likely admit.  Final Clinical Impressions(s) / ED Diagnoses   Final diagnoses:  Angina pectoris [facility](HCC)    ED Discharge Orders    None       Derwood KaplanNanavati, Jora Galluzzo, MD 09/08/18 1445

## 2018-09-08 NOTE — ED Notes (Signed)
Called floor for report; stated charge nurse has not assigned pt yet. And will call; phone # provided.

## 2018-09-08 NOTE — ED Notes (Signed)
Pt. Has cell phone and communicating with her family, husband. Haley Luna [phone] Pt. Requested her phone charger be brought to hospital by husband. Pt. Informed that was fine to leave up front.

## 2018-09-08 NOTE — ED Notes (Signed)
Patient is resting comfortably. 

## 2018-09-08 NOTE — ED Triage Notes (Signed)
Pt here for chest pain that started this morning 1 hour after being awake at 0500. States it was chest pain started suddenly and felt like sitting on her chest. Radiation to jaw and left arm. Pain lasted 1.5 hour, pt sitting when occurred. No pain presently. Pt went to UC and given ASA. 0/10 pain

## 2018-09-08 NOTE — ED Notes (Signed)
Admitting at bedside 

## 2018-09-08 NOTE — ED Notes (Signed)
No needs

## 2018-09-08 NOTE — ED Notes (Signed)
ED TO INPATIENT HANDOFF REPORT  ED Nurse Name and Phone #: Magnus Ivan [phone]  S Name/Age/Gender Haley Luna 63 y.o. female Room/Bed: 041C/041C  Code Status   Code Status: Not on file  Home/SNF/Other Home Patient oriented to: situation Is this baseline? No   Triage Complete: Triage complete  Chief Complaint chest pain/abn ekg  Triage Note Pt here for chest pain that started this morning 1 hour after being awake at 0500. States it was chest pain started suddenly and felt like sitting on her chest. Radiation to jaw and left arm. Pain lasted 1.5 hour, pt sitting when occurred. No pain presently. Pt went to UC and given ASA. 0/10 pain   Allergies Allergies  Allergen Reactions  . Penicillins Other (See Comments)    Reaction as a child. Told she almost died. Did it involve swelling of the face/tongue/throat, SOB, or low BP? Yes Did it involve sudden or severe rash/hives, skin peeling, or any reaction on the inside of your mouth or nose? Yes Did you need to seek medical attention at a hospital or doctor's office? Yes When did it last happen? If all above answers are "NO", may proceed with cephalosporin use.   . Erythromycin Nausea And Vomiting and Rash  . Declomycin [Demeclocycline] Rash  . Lac Bovis Diarrhea and Rash    Level of Care/Admitting Diagnosis ED Disposition    ED Disposition Condition Comment   Admit  Hospital Area: [facility] [[id number]]  Level of Care: Telemetry Cardiac [103]  Covid Evaluation: Screening Protocol (No Symptoms)  Diagnosis: Chest pain [147829]  Admitting Physician: Rollene Rotunda [[id number]]  Attending Physician: Rollene Rotunda [[id number]]  PT Class (Do Not Modify): Observation [104]  PT Acc Code (Do Not Modify): Observation [10022]       B Medical/Surgery History History reviewed. No pertinent past medical history. History reviewed. No pertinent surgical history.   A IV Location/Drains/Wounds Patient  Lines/Drains/Airways Status   Active Line/Drains/Airways    Name:   Placement date:   Placement time:   Site:   Days:   Peripheral IV 09/08/18 Left Forearm   09/08/18    1133    Forearm   less than 1          Intake/Output Last 24 hours No intake or output data in the 24 hours ending 09/08/18 1613  Labs/Imaging Results for orders placed or performed during the hospital encounter of 09/08/18 (from the past 48 hour(s))  Basic metabolic panel     Status: Abnormal   Collection Time: 09/08/18 11:30 AM  Result Value Ref Range   Sodium 138 135 - 145 mmol/L   Potassium 4.0 3.5 - 5.1 mmol/L   Chloride 103 98 - 111 mmol/L   CO2 22 22 - 32 mmol/L   Glucose, Bld 128 (H) 70 - 99 mg/dL   BUN 17 8 - 23 mg/dL   Creatinine, Ser 5.62 0.44 - 1.00 mg/dL   Calcium 9.1 8.9 - 13.0 mg/dL   GFR calc non Af Amer >60 >60 mL/min   GFR calc Af Amer >60 >60 mL/min   Anion gap 13 5 - 15    Comment: Performed at [facility], [address], [place], [place] [address]  CBC     Status: None   Collection Time: 09/08/18 11:30 AM  Result Value Ref Range   WBC 6.6 4.0 - 10.5 K/uL   RBC 4.75 3.87 - 5.11 MIL/uL   Hemoglobin 14.1 12.0 - 15.0 g/dL   HCT  43.1 36.0 - 46.0 %   MCV 90.7 80.0 - 100.0 fL   MCH 29.7 26.0 - 34.0 pg   MCHC 32.7 30.0 - 36.0 g/dL   RDW 40.912.9 81.111.5 - 91.415.5 %   Platelets 261 150 - 400 K/uL   nRBC 0.0 0.0 - 0.2 %    Comment: Performed at [facility], [address] [place], [place] [address]  Troponin I - ONCE - STAT     Status: None   Collection Time: 09/08/18 11:30 AM  Result Value Ref Range   Troponin I <0.03 <0.03 ng/mL    Comment: Performed at [facility], [address] [place], [place] [address]  SARS Coronavirus 2 (CEPHEID - Performed in [facility] hospital lab), Hosp Order     Status: None   Collection Time: 09/08/18 12:37 PM  Result Value Ref Range   SARS Coronavirus 2 NEGATIVE NEGATIVE    Comment: (NOTE) If result is NEGATIVE SARS-CoV-2 target nucleic  acids are NOT DETECTED. The SARS-CoV-2 RNA is generally detectable in upper and lower  respiratory specimens during the acute phase of infection. The lowest  concentration of SARS-CoV-2 viral copies this assay can detect is 250  copies / mL. A negative result does not preclude SARS-CoV-2 infection  and should not be used as the sole basis for treatment or other  patient management decisions.  A negative result may occur with  improper specimen collection / handling, submission of specimen other  than nasopharyngeal swab, presence of viral mutation(s) within the  areas targeted by this assay, and inadequate number of viral copies  (<250 copies / mL). A negative result must be combined with clinical  observations, patient history, and epidemiological information. If result is POSITIVE SARS-CoV-2 target nucleic acids are DETECTED. The SARS-CoV-2 RNA is generally detectable in upper and lower  respiratory specimens dur ing the acute phase of infection.  Positive  results are indicative of active infection with SARS-CoV-2.  Clinical  correlation with patient history and other diagnostic information is  necessary to determine patient infection status.  Positive results do  not rule out bacterial infection or co-infection with other viruses. If result is PRESUMPTIVE POSTIVE SARS-CoV-2 nucleic acids MAY BE PRESENT.   A presumptive positive result was obtained on the submitted specimen  and confirmed on repeat testing.  While 2019 novel coronavirus  (SARS-CoV-2) nucleic acids may be present in the submitted sample  additional confirmatory testing may be necessary for epidemiological  and / or clinical management purposes  to differentiate between  SARS-CoV-2 and other Sarbecovirus currently known to infect humans.  If clinically indicated additional testing with an alternate test  methodology [phone]([id number]) is advised. The SARS-CoV-2 RNA is generally  detectable in upper and lower respiratory  sp ecimens during the acute  phase of infection. The expected result is Negative. Fact Sheet for Patients:  BoilerBrush.com.cyhttps://www.fda.gov/media/136312/download Fact Sheet for Healthcare Providers: https://pope.com/https://www.fda.gov/media/136313/download This test is not yet approved or cleared by the [place] FDA and has been authorized for detection and/or diagnosis of SARS-CoV-2 by FDA under an Emergency Use Authorization (EUA).  This EUA will remain in effect (meaning this test can be used) for the duration of the COVID-19 declaration under Section 564(b)(1) of the Act, 21 U.S.C. section 360bbb-3(b)(1), unless the authorization is terminated or revoked sooner. Performed at [facility], [address] [place], [place] [address]    Dg Chest 2 View  Result Date: 09/08/2018 CLINICAL DATA:  Chest pain EXAM:  CHEST - 2 VIEW COMPARISON:  None. FINDINGS: Cardiac shadows within normal limits. The lungs are well aerated bilaterally. No focal infiltrate or sizable effusion is seen. No acute bony abnormality is noted. IMPRESSION: No active cardiopulmonary disease. Electronically Signed   By: Alcide Clever M.D.   On: 09/08/2018 11:51    Pending Labs [employer] (From admission, onward)    Start     Ordered   Signed and Held  HIV antibody (Routine Testing)  Once,   R     Signed and Held   Signed and Held  Troponin I - Now Then Q6H  Now then every 6 hours,   STAT     Signed and Held   Signed and Held  Hemoglobin A1c  Tomorrow morning,   R     Signed and Held   Signed and Held  Lipid panel  Tomorrow morning,   R     Signed and Held   Signed and Held  Basic metabolic panel  Tomorrow morning,   R     Signed and Held   Signed and Held  Creatinine, serum  (enoxaparin (LOVENOX)    CrCl >/= 30 ml/min)  Once,   R    Comments:  Baseline for enoxaparin therapy IF NOT ALREADY DRAWN.    Signed and Held   Signed and Held  Creatinine, serum  (enoxaparin (LOVENOX)    CrCl >/= 30 ml/min)  Weekly,   R    Comments:   while on enoxaparin therapy    Signed and Held          Vitals/Pain Today's Vitals   09/08/18 1400 09/08/18 1415 09/08/18 1430 09/08/18 1600  BP: (!) 142/83 (!) 142/81 (!) 146/92 (!) 132/97  Pulse: 71 70 67 69  Resp: 15 19 20 12   SpO2: 98% 98% 100% 96%  PainSc:        Isolation Precautions No active isolations  Medications Medications  sodium chloride flush (NS) 0.9 % injection 3 mL (has no administration in time range)  aspirin chewable tablet 324 mg (324 mg Oral Not Given 09/08/18 1228)    Mobility walks Low fall risk   Focused Assessments Cardiac Assessment Handoff:    Lab Results  Component Value Date   TROPONINI <0.03 09/08/2018   No results found for: DDIMER Does the Patient currently have chest pain? No     R Recommendations: See Admitting Provider Note  Report given to:   Additional Notes: n/a

## 2018-09-08 NOTE — H&P (Addendum)
Cardiology Admission History and Physical:   Patient ID: Haley Luna MRN: [id number]; DOB: 12-02-1955   Admission date: 09/08/2018  Primary Care Provider: Junie Spencer, FNP Primary Cardiologist: New to [facility] (Dr. Antoine Poche) Primary Electrophysiologist:  None   Chief Complaint:  Chest Pain  Patient Profile:   Haley Luna is a 63 y.o. female with no significant past medical history who presents to the [facility] ED on 09/08/2018 for evaluation of chest pain.  History of Present Illness:   Haley Luna is a 63 year old female with no significant past medical history. She has never seen a Cardiologist or had any type of cardiac work-up. She has no known history of hypertension, hyperlipidemia, or diabetes mellitus.  Patient presents to the [facility] ED today for further evaluation of chest pain. She reports vague symptoms over the last couple of months including sporadic episodes of extreme exhaustion (2 episodes over the last four months), occasional arm tingling in her left arm in the mornings (likely from sleeping position), and weight gain (about 15 lb weight gain the past 8 months). Patient reports sudden onset of crushing chest pain this morning around 5am that radiated to her jaw and left shoulder. Patient describes the pain as a pressure and ranks it as a 6/10 on the pain scale. Pain was intermittent for about 1.5 hours and then began to resolve independently. Towards the end of this 1.5 hours, patient became to have associated fatigue, lightheadedness, and nausea. She denies any associated shortness of breath, diaphoresis, vomiting, palpitations or syncope. She reports 2 pillow orthopnea that she has had for a while as well as some PND or mild swelling in abdomen and lower extremities recently. She also reports that she snores loudly at night and some times wakes herself up. She has never been tested for sleep apnea. Given symptoms, patient went to a Urgent Care this morning.  They gave her Aspirin and checked an EKG which showed T wave inversions in anterior leads. Therefore, they sent her to the ED for further evaluation.  In the ED, vitals stable. EKG showed normal sinus rhythm with no acute ischemic changes. Initial troponin negative. Chest x-ray showed no acute findings. WBC 6.6, Hgb 14.1, Plts 261. Na 138, K 4.0, Glucose 128, SCr 0.90. COVID-19 testing testing negative.  At the time of this evaluation, patient denies any chest pain.   Patient denies any tobacco, alcohol, or drug use. He mother had a heart attack at the age of 59 but no other known family history of heart disease.  PMH:  None  PSH:  None  Medications Prior to Admission: Prior to Admission medications   Medication Sig Start Date End Date Taking? Authorizing Provider  co-enzyme Q-10 30 MG capsule Take 30 mg by mouth 3 (three) times daily.    [provider]  Omega-3 Fatty Acids (FISH OIL PO) Take by mouth.    [provider]     Allergies:    Allergies  Allergen Reactions   Penicillins Other (See Comments)    Reaction as a child. Told she almost died. Did it involve swelling of the face/tongue/throat, SOB, or low BP? Yes Did it involve sudden or severe rash/hives, skin peeling, or any reaction on the inside of your mouth or nose? Yes Did you need to seek medical attention at a hospital or doctor's office? Yes When did it last happen? If all above answers are NO, may proceed with cephalosporin use.    Erythromycin Nausea  And Vomiting and Rash   Declomycin [Demeclocycline] Rash   Lac Bovis Diarrhea and Rash    Social History:   Social History   Socioeconomic History   Marital status: Married    Spouse name: Not on file   Number of children: Not on file   Years of education: Not on file   Highest education level: Not on file  Occupational History   Not on file  Social Needs   Financial resource strain: Not on file   Food insecurity:     Worry: Not on file    Inability: Not on file   Transportation needs:    Medical: Not on file    Non-medical: Not on file  Tobacco Use   Smoking status: Never Smoker   Smokeless tobacco: Never Used  Substance and Sexual Activity   Alcohol use: No   Drug use: No   Sexual activity: Not on file  Lifestyle   Physical activity:    Days per week: Not on file    Minutes per session: Not on file   Stress: Not on file  Relationships   Social connections:    Talks on phone: Not on file    Gets together: Not on file    Attends religious service: Not on file    Active member of club or organization: Not on file    Attends meetings of clubs or organizations: Not on file    Relationship status: Not on file   Intimate partner violence:    Fear of current or ex partner: Not on file    Emotionally abused: Not on file    Physically abused: Not on file    Forced sexual activity: Not on file  Other Topics Concern   Not on file  Social History Narrative   Not on file    Family History:   The patient's family history includes Heart attack (age of onset: 50) in her mother; Heart disease in her mother.    ROS:  Please see the history of present illness.  Review of Systems  Constitutional: Positive for fever and malaise/fatigue. Negative for chills.  HENT: Negative for sinus pain and sore throat.   Respiratory: Negative for cough, sputum production and shortness of breath.   Cardiovascular: Positive for chest pain, orthopnea, leg swelling and PND. Negative for palpitations.  Gastrointestinal: Positive for nausea. Negative for abdominal pain, blood in stool and vomiting.  Genitourinary: Negative for hematuria.  Musculoskeletal: Negative for myalgias.  Neurological: Positive for tingling. Negative for loss of consciousness. Dizziness: lightheadedness.  Endo/Heme/Allergies: Does not bruise/bleed easily.  Psychiatric/Behavioral: Positive for substance abuse.     Physical Exam/Data:    Vitals:   09/08/18 1400 09/08/18 1415 09/08/18 1430 09/08/18 1600  BP: (!) 142/83 (!) 142/81 (!) 146/92 (!) 132/97  Pulse: 71 70 67 69  Resp: SpO2: 98% 98% 100% 96%   No intake or output data in the 24 hours ending 09/08/18 1611 Last 3 Weights 10/29/2017 06/01/2017 09/14/2016  Weight (lbs) 190 lb 190 lb 194 lb 3.2 oz  Weight (kg) 86.183 kg 86.183 kg 88.089 kg     There is no height or weight on file to calculate BMI.  General: 63 year old female resting comfortably in no acute distress.   HEENT: Normal. Lymph: No adenopathy. Neck: Supple. No JVD. Endocrine:  No thyromegaly. Vascular: No carotid bruits. Radial and distal pedal pulses 2+ and equal bilaterally. Cardiac: RRR. Distinct S1 and S2. No murmurs,  gallops, or rubs. Lungs: No increased work of breathing. Clear to auscultation bilaterally. No wheezes, rhonchi, or rales. Abd: Soft, non-distended, and non-tender to palpation. Bowel sounds present in all 4 quadrant. Ext: No lower extremity edema. Musculoskeletal:  No deformities. BUE and BLE strength normal and equal for age. Skin: Warm and dry. Neuro:  Alert and oriented x3. No focal abnormalities.  abnormalities noted Psych:  Normal affect. Responds appropriately.   EKG:  The ECG that was done was personally reviewed and demonstrates normal sinus rhythm, rate 62 bpm, with low voltage in precordial leads and some T wave flattening in leads III, V2, and V3 but no acute ST/T changes concerning for ischemia.  Telemetry: Telemetry was personally reviewed and demonstrates normal sinus rhythm with rats in the 60's to 80's.  Relevant CV Studies: None.  Laboratory Data:  Chemistry Recent Labs  Lab 09/08/18 1130  NA 138  K 4.0  CL 103  CO2 22  GLUCOSE 128*  BUN 17  CREATININE 0.90  CALCIUM 9.1  GFRNONAA >60  GFRAA >60  ANIONGAP 13    No results for input(s): PROT, ALBUMIN, AST, ALT, ALKPHOS, BILITOT in the last 168 hours. Hematology Recent Labs  Lab  09/08/18 1130  WBC 6.6  RBC 4.75  HGB 14.1  HCT 43.1  MCV 90.7  MCH 29.7  MCHC 32.7  RDW 12.9  PLT 261   Cardiac Enzymes Recent Labs  Lab 09/08/18 1130  TROPONINI <0.03   No results for input(s): TROPIPOC in the last 168 hours.  BNPNo results for input(s): BNP, PROBNP in the last 168 hours.  DDimer No results for input(s): DDIMER in the last 168 hours.  Radiology/Studies:  Dg Chest 2 View  Result Date: 09/08/2018 CLINICAL DATA:  Chest pain EXAM: CHEST - 2 VIEW COMPARISON:  None. FINDINGS: Cardiac shadows within normal limits. The lungs are well aerated bilaterally. No focal infiltrate or sizable effusion is seen. No acute bony abnormality is noted. IMPRESSION: No active cardiopulmonary disease. Electronically Signed   By: Alcide CleverMark  Lukens M.D.   On: 09/08/2018 11:51    Assessment and Plan:   Chest Pain - Patient presents with chest pressure that radiated to jaw and left shoulder. Pain occurred at rest, lasted for about 1.5 hours, and resolved on its own. No known history of CAD, hypertension, hyperlipidemia, or diabetes. - EKG at Urgent Care earlier today showed anterior T wave inversion. EKG here in the ED shows normal sinus rhythm with some T wave flattening but no acute ST/T changes. - Initial troponin negative. Will trend. - Will check lipid panel and hemoglobin A1c. - Patient currently chest pain free.  - Will admit and plan for coronary CT tomorrow.  Severity of Illness: The appropriate patient status for this patient is OBSERVATION. Observation status is judged to be reasonable and necessary in order to provide the required intensity of service to ensure the patient's safety. The patient's presenting symptoms, physical exam findings, and initial radiographic and laboratory data in the context of their medical condition is felt to place them at decreased risk for further clinical deterioration. Furthermore, it is anticipated that the patient will be medically stable for  discharge from the hospital within 2 midnights of admission. The following factors support the patient status of observation.   " The patient's presenting symptoms include chest pain. " The physical exam findings are unremarkable. " The initial radiographic and laboratory data are unremarkable.     For questions or updates, please contact [facility] Please  consult [email or web address] for contact info under    Signed, Rollene Rotunda, MD  09/08/2018 4:11 PM   History and all data above reviewed.  Patient examined.  I agree with the findings as above.  The patient had chest pain this morning as described above.  The pain occurred at rest.  It was severe with radiation to her left shoulder and jaw.  There were subtle anterolateral T wave changes on the EKG at an urgent care.  She has been pain free here and her T wave changes have resolved.    The patient exam reveals COR:RRR  ,  Lungs: Clear  ,  Abd: Positive bowel sounds, no rebound no guarding, Ext No edema  .  All available labs, radiology testing, previous records reviewed. Agree with documented assessment and plan.   Chest pain is typical greater than atypical with dynamic EKG changes.  The patient will be observed overnight and if enzymes remain negative plan CT coronary angiogram.    Rollene Rotunda  4:11 PM  09/08/2018

## 2018-09-09 ENCOUNTER — Observation Stay (HOSPITAL_COMMUNITY): Payer: No Typology Code available for payment source

## 2018-09-09 DIAGNOSIS — R072 Precordial pain: Secondary | ICD-10-CM | POA: Diagnosis not present

## 2018-09-09 DIAGNOSIS — R079 Chest pain, unspecified: Secondary | ICD-10-CM

## 2018-09-09 DIAGNOSIS — E785 Hyperlipidemia, unspecified: Secondary | ICD-10-CM

## 2018-09-09 LAB — BASIC METABOLIC PANEL
Anion gap: 9 (ref 5–15)
BUN: 16 mg/dL (ref 8–23)
CO2: 26 mmol/L (ref 22–32)
Calcium: 9 mg/dL (ref 8.9–10.3)
Chloride: 103 mmol/L (ref 98–111)
Creatinine, Ser: 1.01 mg/dL — ABNORMAL HIGH (ref 0.44–1.00)
GFR calc Af Amer: >60 mL/min (ref >60)
GFR calc non Af Amer: >60 mL/min (ref >60)
Glucose, Bld: 121 mg/dL — ABNORMAL HIGH (ref 70–99)
Potassium: 4.1 mmol/L (ref 3.5–5.1)
Sodium: 138 mmol/L (ref 135–145)

## 2018-09-09 LAB — LIPID PANEL
Cholesterol: 233 mg/dL — ABNORMAL HIGH (ref 0–200)
HDL: 29 mg/dL — ABNORMAL LOW (ref >40)
LDL Cholesterol: 159 mg/dL — ABNORMAL HIGH (ref 0–99)
Total CHOL/HDL Ratio: 8 RATIO
Triglycerides: 225 mg/dL — ABNORMAL HIGH (ref <150)
VLDL: 45 mg/dL — ABNORMAL HIGH (ref 0–40)

## 2018-09-09 LAB — HEMOGLOBIN A1C
Hgb A1c MFr Bld: 6 % — ABNORMAL HIGH (ref 4.8–5.6)
Mean Plasma Glucose: 125.5 mg/dL

## 2018-09-09 LAB — TROPONIN I: Troponin I: <0.03 ng/mL (ref <0.03)

## 2018-09-09 LAB — HIV ANTIBODY (ROUTINE TESTING W REFLEX): HIV Screen 4th Generation wRfx: NONREACTIVE

## 2018-09-09 MED ORDER — NITROGLYCERIN 0.4 MG SL SUBL
SUBLINGUAL_TABLET | SUBLINGUAL | Status: AC
  Filled 2018-09-09: qty 2

## 2018-09-09 MED ORDER — METOPROLOL TARTRATE 25 MG PO TABS
25.0000 mg | ORAL_TABLET | Freq: Once | ORAL | Status: AC
  Administered 2018-09-09: 25 mg via ORAL

## 2018-09-09 MED ORDER — IOHEXOL 350 MG/ML SOLN
80.0000 mL | Freq: Once | INTRAVENOUS | Status: AC | PRN
  Administered 2018-09-09: 80 mL via INTRAVENOUS

## 2018-09-09 MED ORDER — NITROGLYCERIN 0.4 MG SL SUBL
0.8000 mg | SUBLINGUAL_TABLET | Freq: Once | SUBLINGUAL | Status: AC
  Administered 2018-09-09: 0.8 mg via SUBLINGUAL

## 2018-09-09 MED ORDER — METOPROLOL TARTRATE 12.5 MG HALF TABLET: 12.5000 mg | ORAL_TABLET | Freq: Once | ORAL | Status: DC | PRN

## 2018-09-09 NOTE — Progress Notes (Addendum)
Progress Note  Patient Name: Haley Luna Date of Encounter: 09/09/2018  Primary Cardiologist: New to Dr. Antoine PocheHochrein   Subjective   No recurrent chest pain since admit. Denies dyspnea or palpitations.   Inpatient Medications    Scheduled Meds: . aspirin  324 mg Oral Once  . aspirin EC  81 mg Oral Daily  . enoxaparin (LOVENOX) injection  40 mg Subcutaneous Q24H  . sodium chloride flush  3 mL Intravenous Once   Continuous Infusions:  PRN Meds: acetaminophen, nitroGLYCERIN, ondansetron (ZOFRAN) IV   Vital Signs    Vitals:   09/08/18 1728 09/08/18 1730 09/08/18 2055 09/09/18 0457  BP: (!) 138/98  140/84 107/73  Pulse: 64  74 75  Resp: 14     Temp:  97.6 F (36.4 C) 98.6 F (37 C) 97.9 F (36.6 C)  TempSrc: Oral  Oral Oral  SpO2: 98%  95% 93%  Weight: 87.8 kg   87.3 kg  Height: 5\' 6"  (1.676 m)       Intake/Output Summary (Last 24 hours) at 09/09/2018 0808 Last data filed at 09/08/2018 1900 Gross per 24 hour  Intake 360 ml  Output -  Net 360 ml   Last 3 Weights 09/09/2018 09/08/2018 10/29/2017  Weight (lbs) 192 lb 8 oz 193 lb 9.6 oz 190 lb  Weight (kg) 87.317 kg 87.816 kg 86.183 kg      Telemetry    Sr at 60-70s - Personally Reviewed  ECG    NSR at rate of 75 bpm- Personally Reviewed  Physical Exam   GEN: No acute distress.   Neck: No JVD Cardiac: RRR, no murmurs, rubs, or gallops.  Respiratory: Clear to auscultation bilaterally. GI: Soft, nontender, non-distended  MS: No edema; No deformity. Neuro:  Nonfocal  Psych: Normal affect   Labs    Chemistry Recent Labs  Lab 09/08/18 1130 09/08/18 1818 09/09/18 0000  NA 138  --  138  K 4.0  --  4.1  CL 103  --  103  CO2 22  --  26  GLUCOSE 128*  --  121*  BUN 17  --  16  CREATININE 0.90 0.89 1.01*  CALCIUM 9.1  --  9.0  GFRNONAA >60 >60 >60  GFRAA >60 >60 >60  ANIONGAP 13  --  9     Hematology Recent Labs  Lab 09/08/18 1130  WBC 6.6  RBC 4.75  HGB 14.1  HCT 43.1  MCV 90.7  MCH 29.7   MCHC 32.7  RDW 12.9  PLT 261    Cardiac Enzymes Recent Labs  Lab 09/08/18 1130 09/08/18 1818 09/09/18 0000  TROPONINI <0.03 <0.03 <0.03     Radiology    Dg Chest 2 View  Result Date: 09/08/2018 CLINICAL DATA:  Chest pain EXAM: CHEST - 2 VIEW COMPARISON:  None. FINDINGS: Cardiac shadows within normal limits. The lungs are well aerated bilaterally. No focal infiltrate or sizable effusion is seen. No acute bony abnormality is noted. IMPRESSION: No active cardiopulmonary disease. Electronically Signed   By: Alcide CleverMark  Lukens M.D.   On: 09/08/2018 11:51    Cardiac Studies   Pending coronary CT   Patient Profile     Haley Luna is a 63 y.o. female with no significant past medical history who [facility] for evaluation of chest pain.  EKG at Urgent Care showed anterior T wave inversion. EKG here in the ED shows normal sinus rhythm with some T wave flattening but no  acute ST/T changes.  Assessment & Plan    1. Chest pain  - Ruled out. Troponin negative. Repeat EKG this morning without ischemic changes. Remained chest pain free since admit. For coronary CT today.  - 09/09/2018: Cholesterol 233; HDL 29; LDL Cholesterol 159; Triglycerides 225; VLDL 45. She prefers lifestyle changes but willing to consider statin if abnormal CT.  - Hgb A1c 6.  - Continue statin.   For questions or updates, please contact [facility] Please consult [email or web address] for contact info under       Signed, Manson Passey, PA  09/09/2018, 8:08 AM    History and all data above reviewed.  Patient examined.  I agree with the findings as above.  The patient has had no further chest pain.  Enzymes negative. EKG this morning without acute changes.  The patient exam reveals COR:RRR  ,  Lungs: Clear  ,  Abd: Positive bowel sounds, no rebound no guarding, Ext No edema  .  All available labs, radiology testing, previous records reviewed. Agree with documented assessment and plan. CTA  this AM.  OK to go home if no obstructive disease.  She would agree to a statin if she has significant CAD.    Fayrene Fearing [facility]  9:45 AM  09/09/2018

## 2018-09-09 NOTE — Discharge Summary (Signed)
Discharge Summary    Patient ID: NAZARIA CASALI MRN: [id number]; DOB: 03/03/1956  Admit date: 09/08/2018 Discharge date: 09/09/2018  Primary Care Provider: Junie Spencer, FNP  Primary Cardiologist: Dr. Antoine Poche   Discharge Diagnoses    Principal Problem:   Chest pain Active Problems:   Hyperlipidemia LDL goal <100    Allergies Allergies  Allergen Reactions   Penicillins Other (See Comments)    Reaction as a child. Told she almost died. Did it involve swelling of the face/tongue/throat, SOB, or low BP? Yes Did it involve sudden or severe rash/hives, skin peeling, or any reaction on the inside of your mouth or nose? Yes Did you need to seek medical attention at a hospital or doctor's office? Yes When did it last happen? If all above answers are NO, may proceed with cephalosporin use.    Erythromycin Nausea And Vomiting and Rash   Declomycin [Demeclocycline] Rash   Lac Bovis Diarrhea and Rash    Diagnostic Studies/Procedures    Coronary CT  09/09/2018 IMPRESSION: 1. Coronary artery calcium score 0 Agatston units, suggesting low risk for future cardiac events.  2.  No significant coronary artery disease was noted.  No acute or significant extracardiac abnormality.    History of Present Illness     Shantele Bergeson Websteris a 63 y.o.femalewith no significant past medical historywho presents to the [facility] ED on 09/08/2018 for evaluation of chest pain. She has no known history of hypertension, hyperlipidemia, or diabetes mellitus.  She reported vague symptoms over the last couple of months including sporadic episodes of extreme exhaustion (2 episodes over the last four months), occasional arm tingling in her left arm in the mornings (likely from sleeping position), and weight gain (about 15 lb weight gain the past 8 months). Patient reports sudden onset of crushing chest pain in morning 6/1 around 5am that radiated to her jaw and left shoulder. Patient  described the pain as a pressure and ranks it as a 6/10 on the pain scale. Pain was intermittent for about 1.5 hours and then began to resolve independently. Towards the end of this 1.5 hours, patient became to have associated fatigue, lightheadedness, and nausea. She denies any associated shortness of breath, diaphoresis, vomiting, palpitations or syncope. She reports 2 pillow orthopnea that she has had for a while as well as some PND or mild swelling in abdomen and lower extremities recently. She also reports that she snores loudly at night and some times wakes herself up. She has never been tested for sleep apnea. Given symptoms, patient went to a Urgent Care. They gave her Aspirin and checked an EKG which showed T wave inversions in anterior leads. Therefore, they sent her to the ED for further evaluation.  In the ED, vitals stable. EKG here in the ED shows normal sinus rhythm with some T wave flattening but no acute ST/T changes. Initial troponin negative. Chest x-ray showed no acute findings. WBC 6.6, Hgb 14.1, Plts 261. Na 138, K 4.0, Glucose 128, SCr 0.90. COVID-19 testing testing negative.   Hospital Course     Consultants: None  The patient was admitted and ruled out. Troponin negative. Repeat EKG without ischemic changes. She remained chest pain free since admit. Coronary CT showed normal coronaries with 0 calcium score. No extra cardiac abnormality. She is deemed stable for discharge to followup with PCP for lipid control. She wish to try diet and exercise first, this is reasonable given normal coronaries. No followup with cardiology is needed at this  time.   - 09/09/2018: Cholesterol 233; HDL 29; LDL Cholesterol 159; Triglycerides 225; VLDL 45. Hgb A1c 6.   _____________  Discharge Vitals Blood pressure 107/73, pulse 75, temperature 97.9 F (36.6 C), temperature source Oral, resp. rate 14, height 5\' 6"  (1.676 m), weight 87.3 kg, SpO2 93 %.  Filed Weights   09/08/18 1728 09/09/18 0457   Weight: 87.8 kg 87.3 kg    Labs & Radiologic Studies    CBC Recent Labs    09/08/18 1130  WBC 6.6  HGB 14.1  HCT 43.1  MCV 90.7  PLT 261   Basic Metabolic Panel Recent Labs    16/01/9605/01/20 1130 09/08/18 1818 09/09/18 0000  NA 138  --  138  K 4.0  --  4.1  CL 103  --  103  CO2 22  --  26  GLUCOSE 128*  --  121*  BUN 17  --  16  CREATININE 0.90 0.89 1.01*  CALCIUM 9.1  --  9.0   Cardiac Enzymes Recent Labs    09/08/18 1130 09/08/18 1818 09/09/18 0000  TROPONINI <0.03 <0.03 <0.03    Hemoglobin A1C Recent Labs    09/09/18 0000  HGBA1C 6.0*   Fasting Lipid Panel Recent Labs    09/09/18 0000  CHOL 233*  HDL 29*  LDLCALC 159*  TRIG 225*  CHOLHDL 8.0   _____________  Dg Chest 2 View  Result Date: 09/08/2018 CLINICAL DATA:  Chest pain EXAM: CHEST - 2 VIEW COMPARISON:  None. FINDINGS: Cardiac shadows within normal limits. The lungs are well aerated bilaterally. No focal infiltrate or sizable effusion is seen. No acute bony abnormality is noted. IMPRESSION: No active cardiopulmonary disease. Electronically Signed   By: Alcide CleverMark  Lukens M.D.   On: 09/08/2018 11:51   Ct Coronary Morph W/cta Cor W/score W/ca W/cm &/or Wo/cm  Addendum Date: 09/09/2018   ADDENDUM REPORT: 09/09/2018 16:33 CLINICAL DATA:  Chest pain EXAM: Cardiac CTA MEDICATIONS: Sub lingual nitro. 4mg  x 2 TECHNIQUE: The patient was scanned on a Siemens 192 slice scanner. Gantry rotation speed was 250 msecs. Collimation was 0.6 mm. A 100 kV prospective scan was triggered in the ascending thoracic aorta at 35-75% of the R-R interval. Average HR during the scan was 60 bpm. The 3D data set was interpreted on a dedicated work station using MPR, MIP and VRT modes. A total of 80cc of contrast was used. FINDINGS: Non-cardiac: See [facility]. The pulmonary veins drain normally to the left atrium. Calcium Score: 0 Agatston units. Coronary Arteries: Right dominant with no anomalies LM: No  plaque or stenosis. LAD system:  No plaque or stenosis. Circumflex system: No plaque or stenosis. RCA system: No plaque or stenosis. IMPRESSION: 1. Coronary artery calcium score 0 Agatston units, suggesting low risk for future cardiac events. 2.  No significant coronary artery disease was noted. Dalton Mclean Electronically Signed   By: Marca Anconaalton  Mclean M.D.   On: 09/09/2018 16:33   Result Date: 09/09/2018 EXAM: OVER-READ INTERPRETATION  CT CHEST The following report is an over-read performed by radiologist Dr. Charlett NoseKevin Dover of [facility] on 09/09/2018. This over-read does not include interpretation of cardiac or coronary anatomy or pathology. The coronary CTA interpretation by the cardiologist is attached. COMPARISON:  None. FINDINGS: Vascular: Heart is normal size.  Aorta is normal caliber. Mediastinum/Nodes: No adenopathy in the lower mediastinum or hila. Lungs/Pleura: Visualized lungs clear.  No effusions. Upper Abdomen: Imaging into the upper abdomen shows no acute findings. Musculoskeletal:  Chest wall soft tissues are unremarkable. No acute bony abnormality. IMPRESSION: No acute or significant extracardiac abnormality. Electronically Signed: By: Charlett Nose M.D. On: 09/09/2018 12:00   Disposition   Pt is being discharged home today in good condition.  Follow-up Plans & Appointments    Follow-up Information    Junie Spencer, FNP. Schedule an appointment as soon as possible for a visit.   Specialty:  Family Medicine Contact information: [address] [place] [address] [phone]          Discharge Instructions    Diet - low sodium heart healthy   Complete by:  As directed    Increase activity slowly   Complete by:  As directed       Discharge Medications   Allergies as of 09/09/2018      Reactions   Penicillins Other (See Comments)   Reaction as a child. Told she almost died. Did it involve swelling of the face/tongue/throat, SOB, or low BP? Yes Did it  involve sudden or severe rash/hives, skin peeling, or any reaction on the inside of your mouth or nose? Yes Did you need to seek medical attention at a hospital or doctor's office? Yes When did it last happen? If all above answers are NO, may proceed with cephalosporin use.   Erythromycin Nausea And Vomiting, Rash   Declomycin [demeclocycline] Rash   Lac Bovis Diarrhea, Rash      Medication List    TAKE these medications   acetaminophen 500 MG tablet Commonly known as:  TYLENOL Take 500 mg by mouth every 6 (six) hours as needed for mild pain.   co-enzyme Q-10 30 MG capsule Take 30 mg by mouth 3 (three) times daily.   diphenhydrAMINE 12.5 MG chewable tablet Commonly known as:  BENADRYL Chew 12.5 mg by mouth 4 (four) times daily as needed for allergies.   FISH OIL PO Take 1 capsule by mouth at bedtime.        Acute coronary syndrome (MI, NSTEMI, STEMI, etc) this admission?: No.    Outstanding Labs/Studies   Repeat lipid panel per PCP  Duration of Discharge Encounter   Greater than 30 minutes including physician time.  Ramond Dial, PA 09/09/2018, 5:10 PM

## 2019-09-03 ENCOUNTER — Ambulatory Visit (INDEPENDENT_AMBULATORY_CARE_PROVIDER_SITE_OTHER): Payer: No Typology Code available for payment source

## 2019-09-03 ENCOUNTER — Other Ambulatory Visit: Payer: Self-pay

## 2019-09-03 ENCOUNTER — Ambulatory Visit: Payer: No Typology Code available for payment source

## 2019-09-03 DIAGNOSIS — M79671 Pain in right foot: Secondary | ICD-10-CM

## 2020-02-08 IMAGING — DX DG FOOT COMPLETE 3+V*R*
3 series · 3 of 3 positions shown · non-contrast
Comparison: No prior.

CLINICAL DATA: Right foot injury.  Pain base of fifth metatarsal.

EXAM:
RIGHT FOOT COMPLETE - 3+ VIEW

[foot ap]
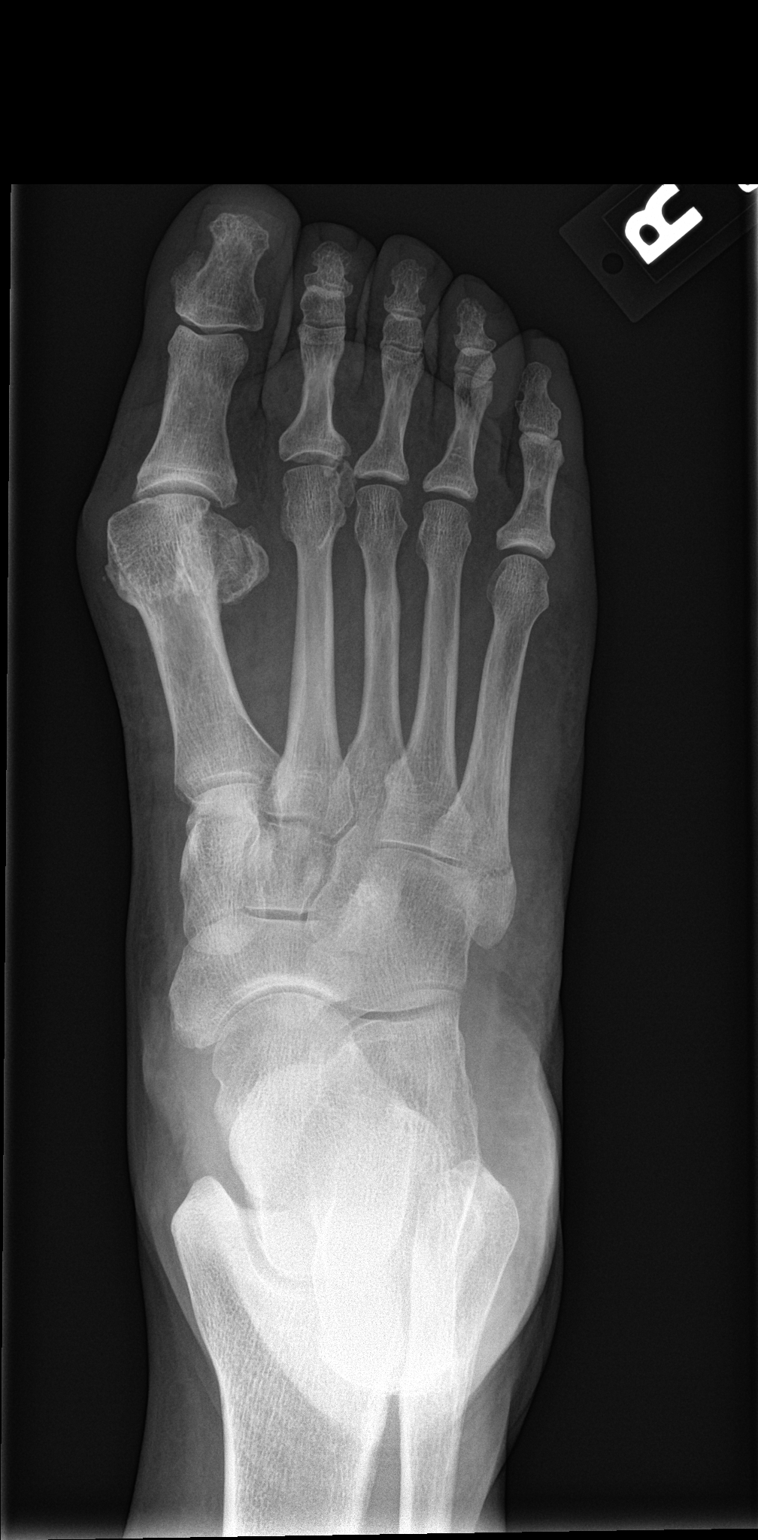

[foot obl]
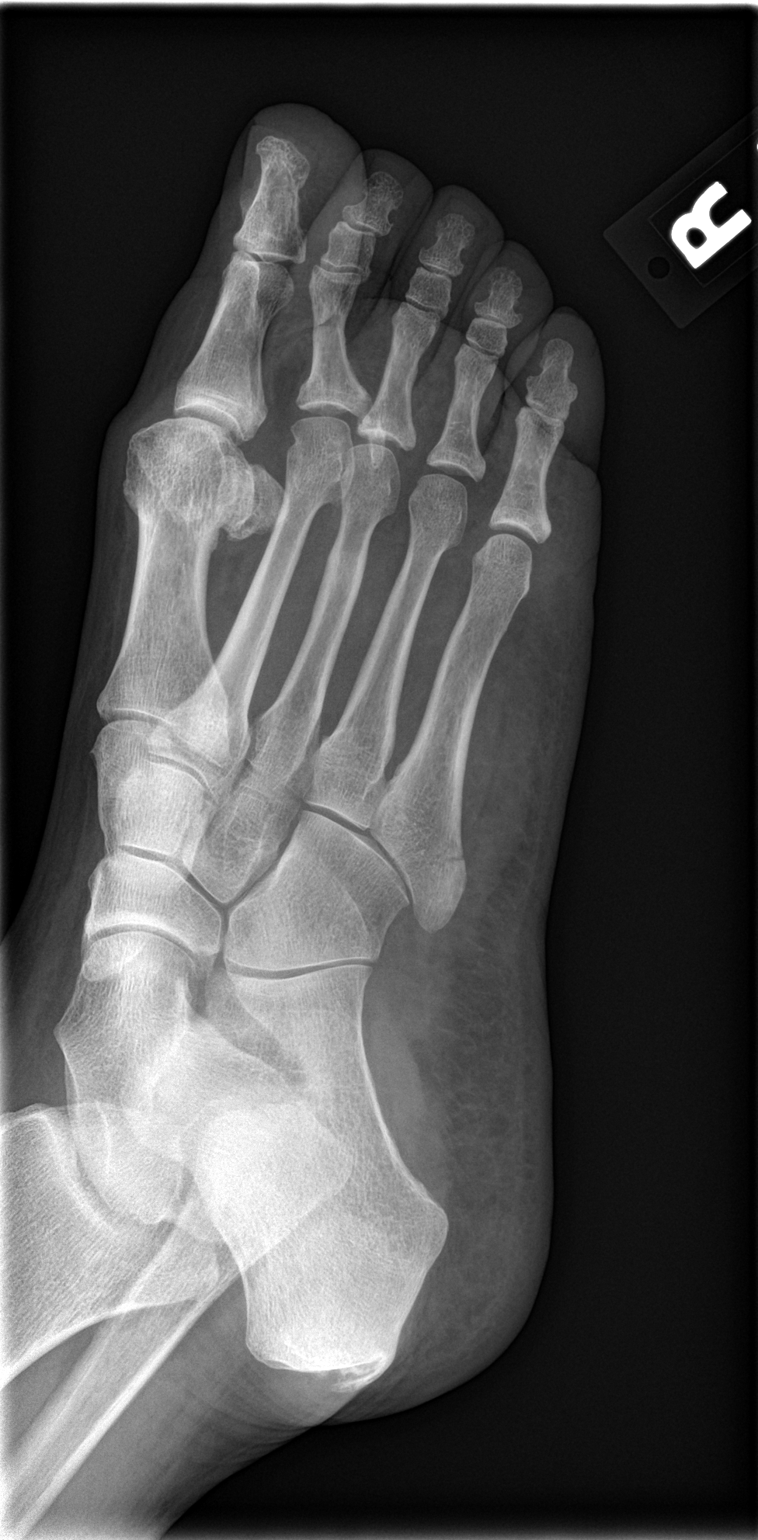

[foot lat]
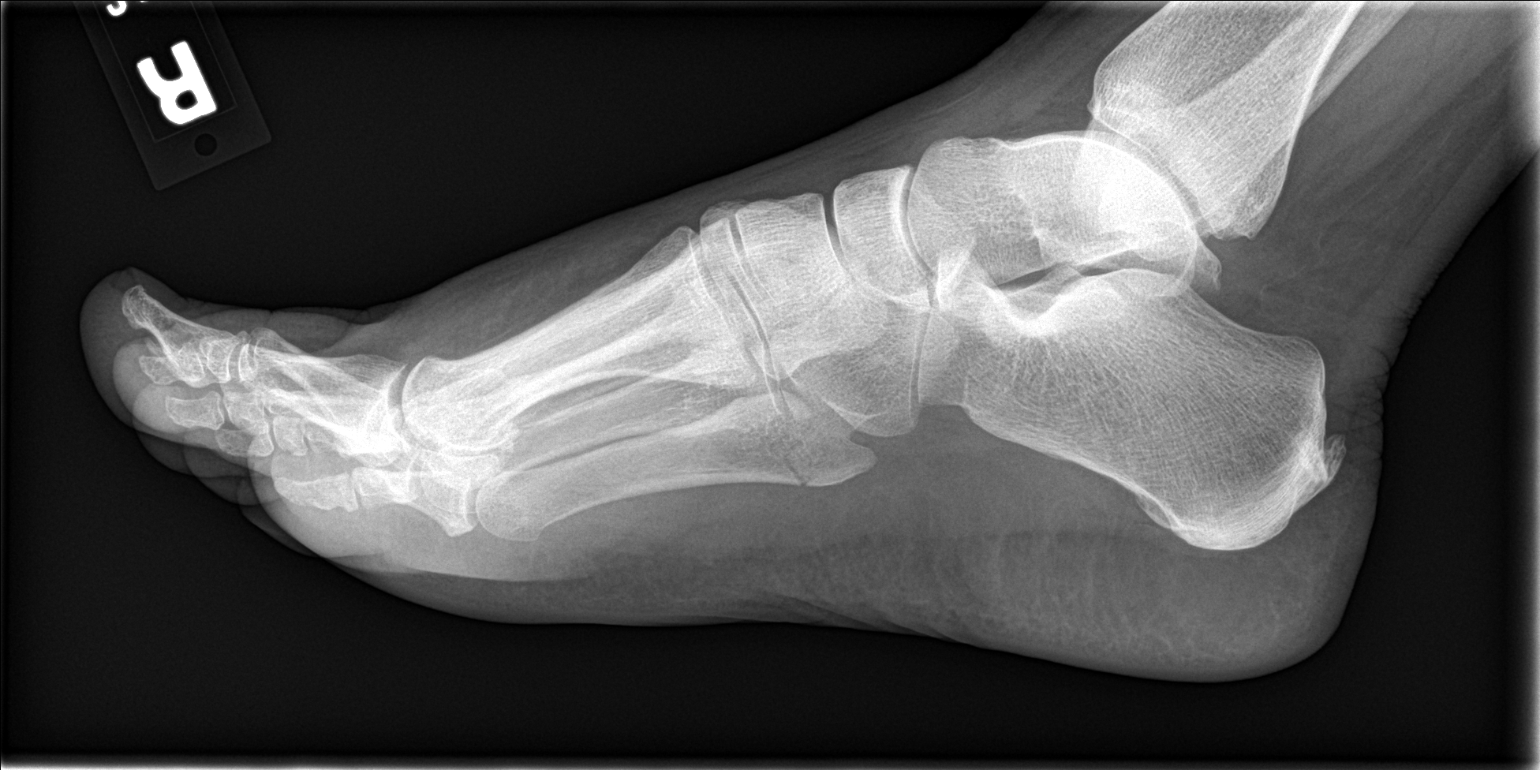

[3 of 3 positions shown; findings below may reference images not displayed]

FINDINGS: Slight displaced fracture of the base of the right fifth metatarsal
noted. Diffuse degenerative change. Degenerative changes most
prominent about the first MTP joint.
IMPRESSION: Slight displaced fracture of the base of the right fifth metatarsal.

## 2021-09-14 ENCOUNTER — Ambulatory Visit: Payer: Commercial Managed Care - PPO | Admitting: Family Medicine

## 2021-09-14 ENCOUNTER — Encounter: Payer: Self-pay | Admitting: Family Medicine

## 2021-09-14 VITALS — BP 122/71 | HR 74 | Temp 97.9°F | Ht 66.0 in | Wt 198.8 lb

## 2021-09-14 DIAGNOSIS — E785 Hyperlipidemia, unspecified: Secondary | ICD-10-CM

## 2021-09-14 DIAGNOSIS — F5102 Adjustment insomnia: Secondary | ICD-10-CM

## 2021-09-14 DIAGNOSIS — R7303 Prediabetes: Secondary | ICD-10-CM | POA: Diagnosis not present

## 2021-09-14 DIAGNOSIS — R030 Elevated blood-pressure reading, without diagnosis of hypertension: Secondary | ICD-10-CM | POA: Diagnosis not present

## 2021-09-14 MED ORDER — TRAZODONE HCL 50 MG PO TABS: 25.0000 mg | ORAL_TABLET | Freq: Every day | ORAL | 2 refills | Status: DC

## 2021-09-14 NOTE — Progress Notes (Signed)
New Patient Office Visit  Assessment & Plan:  1. Elevated BP without diagnosis of hypertension Patient to continue monitoring blood pressure at home. Encouraged to come for a nurse visit to compare her blood pressure cuff to ours for accuracy. Education provided on the DASH diet. Encouraged healthy eating and exercise.  2. Adjustment insomnia Uncontrolled. Starting Trazodone. - traZODone (DESYREL) 50 MG tablet; Take 0.5 tablets (25 mg total) by mouth at bedtime.  Dispense: 30 tablet; Refill: 2  3. Prediabetes - CBC with Differential/Platelet; Future - CMP14+EGFR; Future - Lipid panel; Future - Bayer DCA Hb A1c Waived; Future  4. Hyperlipidemia LDL goal <100 - Lipid panel; Future   Follow-up: Return in about 4 weeks (around 10/12/2021) for follow-up of chronic medication conditions.   [name], MSN, APRN, FNP-C [facility]  Subjective:  Patient ID: Haley Luna, female    DOB: 1955-07-24  Age: 66 y.o. MRN: [id number]  Patient Care Team: Loman Brooklyn, FNP as PCP - General (Family Medicine) Loman Brooklyn, FNP (Family Medicine)  CC:  Chief Complaint  Patient presents with   Establish Care    [facility] pt   Hypertension    X 6 weeks - higher in the morning after she wakes up. 150/97-150/100    HPI Haley Luna presents to establish care. She is not transferring care, but has not been here in over three years.   Patient reports her blood pressure has been elevated consistently in the mornings for the past 6 weeks. She is getting readings 140-160s/85-115s with her wrist cuff and is having more headaches. Denies chest pain, palpitations, arrhythmias, and syncope. She states her blood pressure comes down to normal later in the day. She has been having trouble sleeping since the death of her husband six months ago. She has started watching what she is eating, intermittent fasting, and exercising.    Review of Systems   Constitutional:  Negative for chills, fever, malaise/fatigue and weight loss.  HENT:  Negative for congestion, ear discharge, ear pain, nosebleeds, sinus pain, sore throat and tinnitus.   Eyes:  Negative for blurred vision, double vision, pain, discharge and redness.  Respiratory:  Negative for cough, shortness of breath and wheezing.   Cardiovascular:  Negative for chest pain, palpitations and leg swelling.  Gastrointestinal:  Negative for abdominal pain, constipation, diarrhea, heartburn, nausea and vomiting.  Genitourinary:  Negative for dysuria, frequency and urgency.  Musculoskeletal:  Negative for myalgias.  Skin:  Negative for rash.  Neurological:  Positive for headaches. Negative for dizziness, seizures and weakness.  Psychiatric/Behavioral:  Negative for depression, substance abuse and suicidal ideas. The patient has insomnia. The patient is not nervous/anxious.     Current Outpatient Medications:    co-enzyme Q-10 30 MG capsule, Take 30 mg by mouth 3 (three) times daily., Disp: , Rfl:    Omega-3 Fatty Acids (FISH OIL PO), Take 1 capsule by mouth at bedtime. , Disp: , Rfl:   Allergies  Allergen Reactions   Penicillins Other (See Comments)    Reaction as a child. Told she almost died. Did it involve swelling of the face/tongue/throat, SOB, or low BP? Yes Did it involve sudden or severe rash/hives, skin peeling, or any reaction on the inside of your mouth or nose? Yes Did you need to seek medical attention at a hospital or doctor's office? Yes When did it last happen?       If all above answers are "NO", may proceed with cephalosporin  use.    Poison Ivy Extract Hives   Short Ragweed Pollen Ext Hives   Erythromycin Nausea And Vomiting and Rash   Declomycin [Demeclocycline] Rash   Lac Bovis Diarrhea and Rash    Past Medical History:  Diagnosis Date   Allergy    Obesity     Past Surgical History:  Procedure Laterality Date   COLON SURGERY     FRACTURE SURGERY      LYMPH NODE BIOPSY     age 30   TONSILLECTOMY     age 55    Family History  Problem Relation Age of Onset   Heart disease Mother    Heart attack Mother 47   Miscarriages / [place] Mother    Hearing loss Father    Depression Maternal Grandmother    Diabetes Paternal Grandmother    Mental illness Paternal Grandmother    Alcohol abuse Paternal Grandfather     Social History   Socioeconomic History   Marital status: Single    Spouse name: Not on file   Number of children: Not on file   Years of education: Not on file   Highest education level: Not on file  Occupational History   Not on file  Tobacco Use   Smoking status: Never   Smokeless tobacco: Never  Vaping Use   Vaping Use: Never used  Substance and Sexual Activity   Alcohol use: No   Drug use: No   Sexual activity: Not Currently  Other Topics Concern   Not on file  Social History Narrative   Not on file   Social Determinants of Health   Financial Resource Strain: Not on file  Food Insecurity: Not on file  Transportation Needs: Not on file  Physical Activity: Not on file  Stress: Not on file  Social Connections: Not on file  Intimate Partner Violence: Not on file    Objective:   Today's Vitals: BP 122/71   Pulse 74   Temp 97.9 F (36.6 C) (Temporal)   Ht $R'5\' 6"'nI$  (1.676 m)   Wt 198 lb 12.8 oz (90.2 kg)   SpO2 96%   BMI 32.09 kg/m   Physical Exam Vitals reviewed.  Constitutional:      General: She is not in acute distress.    Appearance: Normal appearance. She is obese. She is not ill-appearing, toxic-appearing or diaphoretic.  HENT:     Head: Normocephalic and atraumatic.  Eyes:     General: No scleral icterus.       Right eye: No discharge.        Left eye: No discharge.     Conjunctiva/sclera: Conjunctivae normal.  Cardiovascular:     Rate and Rhythm: Normal rate and regular rhythm.     Heart sounds: Normal heart sounds. No murmur heard.    No friction rub. No gallop.  Pulmonary:      Effort: Pulmonary effort is normal. No respiratory distress.     Breath sounds: Normal breath sounds. No stridor. No wheezing, rhonchi or rales.  Musculoskeletal:        General: Normal range of motion.     Cervical back: Normal range of motion.  Skin:    General: Skin is warm and dry.     Capillary Refill: Capillary refill takes less than 2 seconds.  Neurological:     General: No focal deficit present.     Mental Status: She is alert and oriented to person, place, and time. Mental status is at baseline.  Psychiatric:  Mood and Affect: Mood normal.        Behavior: Behavior normal.        Thought Content: Thought content normal.        Judgment: Judgment normal.

## 2021-10-20 ENCOUNTER — Ambulatory Visit: Payer: Commercial Managed Care - PPO | Admitting: Family Medicine

## 2021-10-20 ENCOUNTER — Other Ambulatory Visit: Payer: Commercial Managed Care - PPO

## 2021-10-20 ENCOUNTER — Ambulatory Visit: Payer: Commercial Managed Care - PPO | Admitting: *Deleted

## 2021-10-20 DIAGNOSIS — E785 Hyperlipidemia, unspecified: Secondary | ICD-10-CM

## 2021-10-20 DIAGNOSIS — R7303 Prediabetes: Secondary | ICD-10-CM

## 2021-10-20 DIAGNOSIS — R030 Elevated blood-pressure reading, without diagnosis of hypertension: Secondary | ICD-10-CM

## 2021-10-20 LAB — CBC WITH DIFFERENTIAL/PLATELET
Monocytes: 8 %
Platelets: 245 10*3/uL (ref 150–450)

## 2021-10-20 LAB — BAYER DCA HB A1C WAIVED: HB A1C (BAYER DCA - WAIVED): 6.2 % — ABNORMAL HIGH (ref 4.8–5.6)

## 2021-10-20 LAB — LIPID PANEL

## 2021-10-20 NOTE — Progress Notes (Signed)
Pt in today for labwork for PCP Wanting to have BP checked while she was here Left arm w/ large cuff 124/74, then taken left arm w/ sm adult cuff on wrist 124/79 Here BP at home has been running high, she takes it with a wrist cuff about TID running sometimes 160s She will bring her cuff next time to compare it to our readings.

## 2021-10-21 LAB — CBC WITH DIFFERENTIAL/PLATELET
Basophils Absolute: 0 10*3/uL (ref 0.0–0.2)
Basos: 1 %
EOS (ABSOLUTE): 0.1 10*3/uL (ref 0.0–0.4)
Eos: 3 %
Hematocrit: 39.1 % (ref 34.0–46.6)
Hemoglobin: 13.3 g/dL (ref 11.1–15.9)
Immature Grans (Abs): 0 10*3/uL (ref 0.0–0.1)
Immature Granulocytes: 0 %
Lymphocytes Absolute: 1.8 10*3/uL (ref 0.7–3.1)
Lymphs: 35 %
MCH: 30.3 pg (ref 26.6–33.0)
MCHC: 34 g/dL (ref 31.5–35.7)
MCV: 89 fL (ref 79–97)
Monocytes Absolute: 0.4 10*3/uL (ref 0.1–0.9)
Neutrophils Absolute: 2.6 10*3/uL (ref 1.4–7.0)
Neutrophils: 53 %
RBC: 4.39 x10E6/uL (ref 3.77–5.28)
RDW: 13 % (ref 11.7–15.4)
WBC: 5 10*3/uL (ref 3.4–10.8)

## 2021-10-21 LAB — CMP14+EGFR
ALT: 27 IU/L (ref 0–32)
AST: 23 IU/L (ref 0–40)
Albumin/Globulin Ratio: 1.6 (ref 1.2–2.2)
Albumin: 4.1 g/dL (ref 3.9–4.9)
Alkaline Phosphatase: 83 IU/L (ref 44–121)
BUN/Creatinine Ratio: 16 (ref 12–28)
BUN: 15 mg/dL (ref 8–27)
Bilirubin Total: 0.3 mg/dL (ref 0.0–1.2)
CO2: 21 mmol/L (ref 20–29)
Calcium: 9.3 mg/dL (ref 8.7–10.3)
Chloride: 106 mmol/L (ref 96–106)
Creatinine, Ser: 0.94 mg/dL (ref 0.57–1.00)
Globulin, Total: 2.6 g/dL (ref 1.5–4.5)
Glucose: 128 mg/dL — ABNORMAL HIGH (ref 70–99)
Potassium: 4.5 mmol/L (ref 3.5–5.2)
Sodium: 141 mmol/L (ref 134–144)
Total Protein: 6.7 g/dL (ref 6.0–8.5)
eGFR: 68 mL/min/{1.73_m2} (ref >59)

## 2021-10-21 LAB — LIPID PANEL
Chol/HDL Ratio: 5.4 ratio — ABNORMAL HIGH (ref 0.0–4.4)
Cholesterol, Total: 199 mg/dL (ref 100–199)
LDL Chol Calc (NIH): 141 mg/dL — ABNORMAL HIGH (ref 0–99)
Triglycerides: 115 mg/dL (ref 0–149)
VLDL Cholesterol Cal: 21 mg/dL (ref 5–40)

## 2023-05-31 ENCOUNTER — Ambulatory Visit: Payer: Commercial Managed Care - PPO | Admitting: Family Medicine

## 2023-07-10 ENCOUNTER — Ambulatory Visit (INDEPENDENT_AMBULATORY_CARE_PROVIDER_SITE_OTHER): Admitting: Family Medicine

## 2023-07-10 ENCOUNTER — Encounter: Payer: Self-pay | Admitting: Family Medicine

## 2023-07-10 VITALS — BP 136/78 | HR 75 | Temp 97.4°F | Ht 66.0 in | Wt 190.0 lb

## 2023-07-10 DIAGNOSIS — R7303 Prediabetes: Secondary | ICD-10-CM

## 2023-07-10 DIAGNOSIS — Z13228 Encounter for screening for other metabolic disorders: Secondary | ICD-10-CM

## 2023-07-10 DIAGNOSIS — Z130 Encounter for screening for diseases of the blood and blood-forming organs and certain disorders involving the immune mechanism: Secondary | ICD-10-CM

## 2023-07-10 DIAGNOSIS — Z0001 Encounter for general adult medical examination with abnormal findings: Secondary | ICD-10-CM

## 2023-07-10 DIAGNOSIS — E66811 Obesity, class 1: Secondary | ICD-10-CM

## 2023-07-10 DIAGNOSIS — Z6830 Body mass index (BMI) 30.0-30.9, adult: Secondary | ICD-10-CM

## 2023-07-10 DIAGNOSIS — Z0000 Encounter for general adult medical examination without abnormal findings: Secondary | ICD-10-CM

## 2023-07-10 DIAGNOSIS — E785 Hyperlipidemia, unspecified: Secondary | ICD-10-CM

## 2023-07-10 DIAGNOSIS — E6609 Other obesity due to excess calories: Secondary | ICD-10-CM

## 2023-07-10 DIAGNOSIS — Z01818 Encounter for other preprocedural examination: Secondary | ICD-10-CM

## 2023-07-10 DIAGNOSIS — Z1329 Encounter for screening for other suspected endocrine disorder: Secondary | ICD-10-CM

## 2023-07-10 LAB — COAGUCHEK XS/INR WAIVED
INR: 1 (ref 0.9–1.1)
Prothrombin Time: 12.5 s

## 2023-07-10 LAB — BAYER DCA HB A1C WAIVED: HB A1C (BAYER DCA - WAIVED): 6.1 % — ABNORMAL HIGH (ref 4.8–5.6)

## 2023-07-10 NOTE — Patient Instructions (Signed)

## 2023-07-10 NOTE — Progress Notes (Signed)
 Complete physical exam  Patient: Haley Luna   DOB: 1955-12-01   68 y.o. Female  MRN: [id number]  Subjective:    Chief Complaint  Patient presents with   Annual Exam    Haley Luna is a 68 y.o. female who presents today for a complete physical exam. She reports consuming a  fairly well balanced  diet. Gym/ health club routine includes low impact aerobics and mod to heavy weightlifting. She generally feels well. She reports sleeping fairly well. She does not have additional problems to discuss today.   She will be having a D&C for some mild postmenopausal bleeding. GYN will be faxing a surgical clearance form. She has had endometrial biopsies that were normal.   Most recent fall risk assessment:    09/14/2021   11:36 AM  Fall Risk   Falls in the past year? 0     Most recent depression screenings:    09/14/2021   11:36 AM 10/29/2017    8:16 AM  PHQ 2/9 Scores  PHQ - 2 Score 1 1  PHQ- 9 Score 3     Vision:Not within last year  and Dental: No current dental problems and Receives regular dental care  Past Medical History:  Diagnosis Date   Allergy    Obesity    Prediabetes 2020      Patient Care Team: Gabriel Earing, FNP as PCP - General (Family Medicine)   Outpatient Medications Prior to Visit  Medication Sig   co-enzyme Q-10 30 MG capsule Take 30 mg by mouth 3 (three) times daily.   Omega-3 Fatty Acids (FISH OIL PO) Take 1 capsule by mouth at bedtime.    [DISCONTINUED] traZODone (DESYREL) 50 MG tablet Take 0.5 tablets (25 mg total) by mouth at bedtime.   No facility-administered medications prior to visit.    ROS Negative unless specially indicated above in HPI.     Objective:     BP 136/78   Pulse 75   Temp (!) 97.4 F (36.3 C) (Temporal)   Ht 5\' 6"  (1.676 m)   Wt 190 lb (86.2 kg)   SpO2 97%   BMI 30.67 kg/m    Physical Exam Vitals and nursing note reviewed.  Constitutional:      General: She is not in acute distress.     Appearance: She is obese. She is not ill-appearing, toxic-appearing or diaphoretic.  HENT:     Head: Normocephalic.     Right Ear: Tympanic membrane, ear canal and external ear normal.     Left Ear: Tympanic membrane, ear canal and external ear normal.     Nose: Nose normal.     Mouth/Throat:     Mouth: Mucous membranes are moist.     Pharynx: Oropharynx is clear.  Eyes:     Extraocular Movements: Extraocular movements intact.     Conjunctiva/sclera: Conjunctivae normal.     Pupils: Pupils are equal, round, and reactive to light.  Neck:     Thyroid: No thyroid mass, thyromegaly or thyroid tenderness.  Cardiovascular:     Rate and Rhythm: Normal rate and regular rhythm.     Pulses: Normal pulses.     Heart sounds: Normal heart sounds. No murmur heard.    No friction rub. No gallop.  Pulmonary:     Effort: Pulmonary effort is normal.     Breath sounds: Normal breath sounds.  Abdominal:     General: Bowel sounds are normal. There is no distension.  Palpations: Abdomen is soft. There is no mass.     Tenderness: There is no abdominal tenderness. There is no guarding.  Musculoskeletal:        General: No swelling or tenderness. Normal range of motion.     Cervical back: Normal range of motion and neck supple. No tenderness.  Skin:    General: Skin is warm and dry.     Capillary Refill: Capillary refill takes less than 2 seconds.     Findings: No lesion or rash.  Neurological:     General: No focal deficit present.     Mental Status: She is alert and oriented to person, place, and time.     Cranial Nerves: No cranial nerve deficit.     Motor: No weakness.     Coordination: Coordination normal.     Gait: Gait normal.  Psychiatric:        Mood and Affect: Mood normal.        Behavior: Behavior normal.        Thought Content: Thought content normal.        Judgment: Judgment normal.      No results found for any visits on 07/10/23.     Assessment & Plan:    Routine  Health Maintenance and Physical Exam  Haley "Fannie Knee" was seen today for annual exam.  Diagnoses and all orders for this visit:  Routine general medical examination at a health care facility  Class 1 obesity due to excess calories with serious comorbidity and body mass index (BMI) of 30.0 to 30.9 in adult Continue diet, exercise. Fasting labs pending.  -     CMP14+EGFR -     CBC with Differential/Platelet -     Lipid panel -     TSH  Prediabetes A1c pending.  -     CMP14+EGFR -     Lipid panel -     Bayer DCA Hb A1c Waived  Hyperlipidemia LDL goal <100 -     Lipid panel  Screening for endocrine, metabolic and immunity disorder -     TSH  Pre-op exam Awaiting clearance form from GYN. Labs pending.  -     CMP14+EGFR -     CBC with Differential/Platelet -     TSH -     Bayer DCA Hb A1c Waived -     CoaguChek XS/INR Waived    Immunization History  Administered Date(s) Administered   Influenza Inj Mdck Quad Pf 01/14/2023   Influenza,inj,Quad PF,6+ Mos 01/30/2018, 01/22/2022   PFIZER(Purple Top)SARS-COV-2 Vaccination 02/28/2020   Tdap 02/10/2014    Health Maintenance  Topic Date Due   Fecal DNA (Cologuard)  Never done   Pneumonia Vaccine 65+ Years old (1 of 1 - PCV) 07/09/2024 (Originally 01/21/2022)   MAMMOGRAM  07/09/2024 (Originally 03/02/2021)   DEXA SCAN  07/09/2024 (Originally 01/21/2022)   Hepatitis C Screening  07/09/2024 (Originally 01/22/1975)   COVID-19 Vaccine (2 - 2024-25 season) 07/25/2024 (Originally 12/09/2022)   Zoster Vaccines- Shingrix (1 of 2) 10/08/2024 (Originally 01/22/2007)   INFLUENZA VACCINE  11/08/2023   DTaP/Tdap/Td (2 - Td or Tdap) 02/11/2024   HPV VACCINES  Aged Out    Discussed health benefits of physical activity, and encouraged her to engage in regular exercise appropriate for her age and condition.  Problem List Items Addressed This Visit       Other   Hyperlipidemia LDL goal <100   Relevant Orders   Lipid panel   Prediabetes    Relevant  Orders   CMP14+EGFR   Lipid panel   Bayer DCA Hb A1c Waived   Other Visit Diagnoses       Routine general medical examination at a health care facility    -  Primary     Class 1 obesity due to excess calories with serious comorbidity and body mass index (BMI) of 30.0 to 30.9 in adult       Relevant Orders   CMP14+EGFR   CBC with Differential/Platelet   Lipid panel   TSH     Screening for endocrine, metabolic and immunity disorder       Relevant Orders   TSH     Pre-op exam       Relevant Orders   CMP14+EGFR   CBC with Differential/Platelet   TSH   Bayer DCA Hb A1c Waived   CoaguChek XS/INR Waived      Return in 1 year (on 07/09/2024).   The patient indicates understanding of these issues and agrees with the plan.  Gabriel Earing, FNP

## 2023-07-11 ENCOUNTER — Encounter: Payer: Self-pay | Admitting: Family Medicine

## 2023-07-11 LAB — CBC WITH DIFFERENTIAL/PLATELET
Basophils Absolute: 0 10*3/uL (ref 0.0–0.2)
Basos: 1 %
EOS (ABSOLUTE): 0.2 10*3/uL (ref 0.0–0.4)
Eos: 5 %
Hematocrit: 39.5 % (ref 34.0–46.6)
Hemoglobin: 13.2 g/dL (ref 11.1–15.9)
Immature Grans (Abs): 0 10*3/uL (ref 0.0–0.1)
Immature Granulocytes: 0 %
Lymphocytes Absolute: 1.8 10*3/uL (ref 0.7–3.1)
Lymphs: 35 %
MCH: 30.7 pg (ref 26.6–33.0)
MCHC: 33.4 g/dL (ref 31.5–35.7)
MCV: 92 fL (ref 79–97)
Monocytes Absolute: 0.4 10*3/uL (ref 0.1–0.9)
Monocytes: 8 %
Neutrophils Absolute: 2.5 10*3/uL (ref 1.4–7.0)
Neutrophils: 51 %
Platelets: 276 10*3/uL (ref 150–450)
RBC: 4.3 x10E6/uL (ref 3.77–5.28)
RDW: 12.9 % (ref 11.7–15.4)
WBC: 5 10*3/uL (ref 3.4–10.8)

## 2023-07-11 LAB — CMP14+EGFR
ALT: 23 IU/L (ref 0–32)
AST: 22 IU/L (ref 0–40)
Albumin: 4.1 g/dL (ref 3.9–4.9)
Alkaline Phosphatase: 98 IU/L (ref 44–121)
BUN/Creatinine Ratio: 16 (ref 12–28)
BUN: 14 mg/dL (ref 8–27)
Bilirubin Total: 0.4 mg/dL (ref 0.0–1.2)
CO2: 22 mmol/L (ref 20–29)
Calcium: 8.9 mg/dL (ref 8.7–10.3)
Chloride: 106 mmol/L (ref 96–106)
Creatinine, Ser: 0.85 mg/dL (ref 0.57–1.00)
Globulin, Total: 2.6 g/dL (ref 1.5–4.5)
Glucose: 110 mg/dL — ABNORMAL HIGH (ref 70–99)
Potassium: 4.5 mmol/L (ref 3.5–5.2)
Sodium: 142 mmol/L (ref 134–144)
Total Protein: 6.7 g/dL (ref 6.0–8.5)
eGFR: 76 mL/min/{1.73_m2} (ref >59)

## 2023-07-11 LAB — LIPID PANEL
Chol/HDL Ratio: 6.7 ratio — ABNORMAL HIGH (ref 0.0–4.4)
Cholesterol, Total: 240 mg/dL — ABNORMAL HIGH (ref 100–199)
HDL: 36 mg/dL — ABNORMAL LOW (ref >39)
LDL Chol Calc (NIH): 181 mg/dL — ABNORMAL HIGH (ref 0–99)
Triglycerides: 125 mg/dL (ref 0–149)
VLDL Cholesterol Cal: 23 mg/dL (ref 5–40)

## 2023-07-11 LAB — TSH: TSH: 3.04 u[IU]/mL (ref 0.450–4.500)

## 2023-07-12 ENCOUNTER — Telehealth: Payer: Self-pay

## 2023-07-12 ENCOUNTER — Other Ambulatory Visit: Payer: Self-pay | Admitting: Family Medicine

## 2023-07-12 DIAGNOSIS — Z1231 Encounter for screening mammogram for malignant neoplasm of breast: Secondary | ICD-10-CM

## 2023-07-12 NOTE — Telephone Encounter (Signed)
 Copied from CRM [id number]. Topic: General - Other >> Jul 12, 2023 12:43 PM Shardie S wrote: Reason for [facility]:QIONGEX requesting the status of her pre-surgery form.

## 2023-07-12 NOTE — Telephone Encounter (Signed)
 I spoke to pt and advised we have faxed the form but her OBGYN states they haven't received it. Refaxed form and pt aware.

## 2023-07-17 LAB — COLOGUARD: COLOGUARD: NEGATIVE

## 2023-07-17 LAB — EXTERNAL GENERIC LAB PROCEDURE: COLOGUARD: NEGATIVE

## 2023-07-18 ENCOUNTER — Encounter (HOSPITAL_COMMUNITY): Payer: Self-pay | Admitting: Obstetrics and Gynecology

## 2023-07-18 NOTE — Progress Notes (Signed)
 Spoke w/ via phone for pre-op interview--- Haley Luna Lab needs dos---- CBC per surgeon. NONE per anesthesia        Lab results------ COVID test -----patient states asymptomatic no test needed Arrive at -------1400 NPO after MN NO Solid Food.  Clear liquids from MN until---1300 Pre-Surgery Ensure or G2:  Med rec completed Medications to take morning of surgery -----NONE Diabetic medication -----  GLP1 agonist last dose: GLP1 instructions:  Patient instructed no nail polish to be worn day of surgery Patient instructed to bring photo id and insurance card day of surgery Patient aware to have Driver (ride ) / caregiver    for 24 hours after surgery - [facility] Patient Special Instructions ----- Shower with antibacterial soap. Pre-Op special Instructions -----  Patient verbalized understanding of instructions that were given at this phone interview. Patient denies chest pain, sob, fever, cough at the interview.

## 2023-07-26 NOTE — H&P (Signed)
 Haley Luna is an 68 y.o. female G2P2 with postmenopausal bleeding and thickened endometrium for hysteroscopy, D&C, possible myosure.  D/W pt r/b/a of surgery including but not limited to bleeding, infection, damage to surrounding organs/uterus, trouble healing.  Prescribed post op meds - ibuprofen and percocet.  Pertinent Gynecological History: G2P2 SVD x 2 Last pap 06/26/23 WNL LMP 2008 No STI PMB recently - US  cervical polyp, thickened endometrium  Menstrual History:  No LMP recorded. Patient is postmenopausal.    Past Medical History:  Diagnosis Date   Allergy    Obesity    Pre-diabetes    A1C 6.1 as of 07/10/23.  Borderline HTN, hypercholesterolemia, seasonal allergies  Past Surgical History:  Procedure Laterality Date   COLON SURGERY     FRACTURE SURGERY     LYMPH NODE BIOPSY     age 17   TONSILLECTOMY     age 38  Excision of cyst under tongue  Family History  Problem Relation Age of Onset   Heart disease Mother    Heart attack Mother 12   Miscarriages / Stillbirths Mother    Hearing loss Father    Depression Maternal Grandmother    Diabetes Paternal Grandmother    Mental illness Paternal Grandmother    Alcohol abuse Paternal Grandfather   Kidney stone, cervical cancer  Social History:  reports that she has never smoked. She has never used smokeless tobacco. She reports that she does not drink alcohol and does not use drugs.married, production designer, theatre/television/film at [employer]  Allergies:  Allergies  Allergen Reactions   Penicillins Other (See Comments)    Reaction as a child. Told she almost died. Did it involve swelling of the face/tongue/throat, SOB, or low BP? Yes Did it involve sudden or severe rash/hives, skin peeling, or any reaction on the inside of your mouth or nose? Yes Did you need to seek medical attention at a hospital or doctor's office? Yes When did it last happen?       If all above answers are "NO", may proceed with cephalosporin use.    Poison Ivy  Extract Hives   Short Ragweed Pollen Ext Hives   Erythromycin Nausea And Vomiting and Rash   Declomycin [Demeclocycline] Rash   Milk (Cow) Diarrhea and Rash    No medications prior to admission.    Review of Systems  Constitutional: Negative.   Respiratory: Negative.    Gastrointestinal: Negative.   Genitourinary:  Positive for menstrual problem and pelvic pain.  Musculoskeletal: Negative.   Skin: Negative.   Neurological: Negative.   Psychiatric/Behavioral: Negative.      Height 5' 6 (1.676 m), weight 86.2 kg. Physical Exam Constitutional:      Appearance: Normal appearance.  HENT:     Head: Normocephalic and atraumatic.  Cardiovascular:     Rate and Rhythm: Normal rate and regular rhythm.  Pulmonary:     Effort: Pulmonary effort is normal.     Breath sounds: Normal breath sounds.  Abdominal:     General: Bowel sounds are normal.     Palpations: Abdomen is soft.  Genitourinary:    General: Normal vulva.     Rectum: Normal.  Musculoskeletal:        General: Normal range of motion.     Cervical back: Normal range of motion and neck supple.  Skin:    General: Skin is warm and dry.  Neurological:     General: No focal deficit present.     Mental Status: She is alert and oriented  to person, place, and time.  Psychiatric:        Mood and Affect: Mood normal.        Behavior: Behavior normal.     US  cervical polyp, thickened endometrium  Assessment/Plan: 67yp G2P2 with PMB, cervical polyp and thickened endometrium on US  - for hysteroscopy, D&C,possible myosure D/w pt r/b/a of surgery Given post op meds   Haley Luna 07/26/2023, 10:17 PM

## 2023-07-29 ENCOUNTER — Ambulatory Visit (HOSPITAL_COMMUNITY)
Admission: RE | Admit: 2023-07-29 | Discharge: 2023-07-29 | Disposition: A | Discharge status: Home / Self Care | Attending: Obstetrics and Gynecology | Admitting: Obstetrics and Gynecology

## 2023-07-29 ENCOUNTER — Other Ambulatory Visit: Payer: Self-pay

## 2023-07-29 ENCOUNTER — Ambulatory Visit (HOSPITAL_BASED_OUTPATIENT_CLINIC_OR_DEPARTMENT_OTHER): Admitting: Anesthesiology

## 2023-07-29 ENCOUNTER — Encounter (HOSPITAL_COMMUNITY)
Admission: RE | Disposition: A | Discharge status: Home / Self Care | Payer: Self-pay | Source: Home / Self Care | Attending: Obstetrics and Gynecology

## 2023-07-29 ENCOUNTER — Encounter (HOSPITAL_COMMUNITY): Payer: Self-pay | Admitting: Obstetrics and Gynecology

## 2023-07-29 ENCOUNTER — Ambulatory Visit (HOSPITAL_COMMUNITY): Admitting: Anesthesiology

## 2023-07-29 DIAGNOSIS — Z01818 Encounter for other preprocedural examination: Secondary | ICD-10-CM

## 2023-07-29 DIAGNOSIS — N950 Postmenopausal bleeding: Secondary | ICD-10-CM | POA: Diagnosis present

## 2023-07-29 DIAGNOSIS — R7303 Prediabetes: Secondary | ICD-10-CM | POA: Diagnosis not present

## 2023-07-29 DIAGNOSIS — C541 Malignant neoplasm of endometrium: Secondary | ICD-10-CM | POA: Insufficient documentation

## 2023-07-29 DIAGNOSIS — R9389 Abnormal findings on diagnostic imaging of other specified body structures: Secondary | ICD-10-CM | POA: Diagnosis present

## 2023-07-29 HISTORY — PX: DILATATION & CURRETTAGE/HYSTEROSCOPY WITH RESECTOCOPE: SHX5572

## 2023-07-29 HISTORY — DX: Prediabetes: R73.03

## 2023-07-29 SURGERY — DILATATION & CURETTAGE/HYSTEROSCOPY WITH RESECTOCOPE
Anesthesia: General | Site: Vagina

## 2023-07-29 MED ORDER — OXYCODONE HCL 5 MG/5ML PO SOLN: 5.0000 mg | Freq: Once | ORAL | Status: DC | PRN

## 2023-07-29 MED ORDER — LACTATED RINGERS IV SOLN
INTRAVENOUS | Status: DC
Start: 2023-07-29 — End: 2023-07-29

## 2023-07-29 MED ORDER — EPHEDRINE SULFATE-NACL 50-0.9 MG/10ML-% IV SOSY
PREFILLED_SYRINGE | INTRAVENOUS | Status: DC | PRN
  Administered 2023-07-29: 7.5 mg via INTRAVENOUS
  Administered 2023-07-29: 5 mg via INTRAVENOUS
  Administered 2023-07-29: 7.5 mg via INTRAVENOUS

## 2023-07-29 MED ORDER — CHLORHEXIDINE GLUCONATE 0.12 % MT SOLN
OROMUCOSAL | Status: AC
  Administered 2023-07-29: 15 mL via OROMUCOSAL
  Filled 2023-07-29: qty 15

## 2023-07-29 MED ORDER — ONDANSETRON HCL 4 MG/2ML IJ SOLN
INTRAMUSCULAR | Status: DC | PRN
  Administered 2023-07-29: 4 mg via INTRAVENOUS

## 2023-07-29 MED ORDER — FENTANYL CITRATE (PF) 250 MCG/5ML IJ SOLN
INTRAMUSCULAR | Status: AC
  Filled 2023-07-29: qty 5

## 2023-07-29 MED ORDER — KETOROLAC TROMETHAMINE 30 MG/ML IJ SOLN
INTRAMUSCULAR | Status: AC
Start: 2023-07-29 — End: ?
  Filled 2023-07-29: qty 1

## 2023-07-29 MED ORDER — ACETAMINOPHEN 500 MG PO TABS: 1000.0000 mg | ORAL_TABLET | ORAL | Status: AC

## 2023-07-29 MED ORDER — CHLORHEXIDINE GLUCONATE 0.12 % MT SOLN: 15.0000 mL | Freq: Once | OROMUCOSAL | Status: AC

## 2023-07-29 MED ORDER — PHENYLEPHRINE 80 MCG/ML (10ML) SYRINGE FOR IV PUSH (FOR BLOOD PRESSURE SUPPORT)
PREFILLED_SYRINGE | INTRAVENOUS | Status: AC
  Filled 2023-07-29: qty 20

## 2023-07-29 MED ORDER — PROPOFOL 10 MG/ML IV BOLUS
INTRAVENOUS | Status: AC
  Filled 2023-07-29: qty 20

## 2023-07-29 MED ORDER — MIDAZOLAM HCL 2 MG/2ML IJ SOLN
INTRAMUSCULAR | Status: AC
  Filled 2023-07-29: qty 2

## 2023-07-29 MED ORDER — LACTATED RINGERS IV SOLN: INTRAVENOUS | Status: DC

## 2023-07-29 MED ORDER — LIDOCAINE HCL 1 % IJ SOLN
INTRAMUSCULAR | Status: AC
  Filled 2023-07-29: qty 20

## 2023-07-29 MED ORDER — LIDOCAINE HCL 1 % IJ SOLN
INTRAMUSCULAR | Status: DC | PRN
Start: 2023-07-29 — End: 2023-07-29
  Administered 2023-07-29: 10 mL

## 2023-07-29 MED ORDER — PROPOFOL 10 MG/ML IV BOLUS
INTRAVENOUS | Status: DC | PRN
  Administered 2023-07-29: 150 mg via INTRAVENOUS

## 2023-07-29 MED ORDER — SODIUM CHLORIDE 0.9 % IR SOLN
Status: DC | PRN
  Administered 2023-07-29 (×2): 3000 mL

## 2023-07-29 MED ORDER — MIDAZOLAM HCL 2 MG/2ML IJ SOLN
INTRAMUSCULAR | Status: DC | PRN
Start: 2023-07-29 — End: 2023-07-29
  Administered 2023-07-29: 1 mg via INTRAVENOUS

## 2023-07-29 MED ORDER — FENTANYL CITRATE (PF) 250 MCG/5ML IJ SOLN
INTRAMUSCULAR | Status: DC | PRN
  Administered 2023-07-29 (×2): 50 ug via INTRAVENOUS

## 2023-07-29 MED ORDER — KETOROLAC TROMETHAMINE 30 MG/ML IJ SOLN
INTRAMUSCULAR | Status: DC | PRN
  Administered 2023-07-29: 30 mg via INTRAVENOUS

## 2023-07-29 MED ORDER — ACETAMINOPHEN 500 MG PO TABS
ORAL_TABLET | ORAL | Status: AC
Start: 2023-07-29 — End: 2023-07-29
  Administered 2023-07-29: 1000 mg via ORAL
  Filled 2023-07-29: qty 2

## 2023-07-29 MED ORDER — PHENYLEPHRINE 80 MCG/ML (10ML) SYRINGE FOR IV PUSH (FOR BLOOD PRESSURE SUPPORT)
PREFILLED_SYRINGE | INTRAVENOUS | Status: DC | PRN
  Administered 2023-07-29: 80 ug via INTRAVENOUS
  Administered 2023-07-29: 40 ug via INTRAVENOUS
  Administered 2023-07-29: 160 ug via INTRAVENOUS
  Administered 2023-07-29: 120 ug via INTRAVENOUS

## 2023-07-29 MED ORDER — DEXAMETHASONE SODIUM PHOSPHATE 10 MG/ML IJ SOLN
INTRAMUSCULAR | Status: DC | PRN
  Administered 2023-07-29: 5 mg via INTRAVENOUS

## 2023-07-29 MED ORDER — CHLORHEXIDINE GLUCONATE 0.12 % MT SOLN: 15.0000 mL | OROMUCOSAL | Status: AC

## 2023-07-29 MED ORDER — ORAL CARE MOUTH RINSE: 15.0000 mL | Freq: Once | OROMUCOSAL | Status: AC

## 2023-07-29 MED ORDER — POVIDONE-IODINE 10 % EX SWAB
2.0000 | Freq: Once | CUTANEOUS | Status: DC
Start: 2023-07-29 — End: 2023-07-29

## 2023-07-29 MED ORDER — LIDOCAINE 2% (20 MG/ML) 5 ML SYRINGE
INTRAMUSCULAR | Status: DC | PRN
Start: 2023-07-29 — End: 2023-07-29
  Administered 2023-07-29: 60 mg via INTRAVENOUS

## 2023-07-29 MED ORDER — FENTANYL CITRATE (PF) 100 MCG/2ML IJ SOLN: 25.0000 ug | INTRAMUSCULAR | Status: DC | PRN

## 2023-07-29 MED ORDER — OXYCODONE HCL 5 MG PO TABS: 5.0000 mg | ORAL_TABLET | Freq: Once | ORAL | Status: DC | PRN

## 2023-07-29 MED ORDER — AMISULPRIDE (ANTIEMETIC) 5 MG/2ML IV SOLN: 10.0000 mg | Freq: Once | INTRAVENOUS | Status: DC | PRN

## 2023-07-29 SURGICAL SUPPLY — 18 items
CATH ROBINSON RED A/P 16FR (CATHETERS) ×1 IMPLANT
DEVICE MYOSURE REACH (MISCELLANEOUS) IMPLANT
GLOVE BIO SURGEON STRL SZ 6.5 (GLOVE) ×1 IMPLANT
GLOVE BIOGEL PI IND STRL 7.0 (GLOVE) IMPLANT
GLOVE SURG SS PI 7.0 STRL IVOR (GLOVE) IMPLANT
GLOVE SURG UNDER POLY LF SZ7 (GLOVE) ×1 IMPLANT
GOWN STRL REUS W/ TWL LRG LVL3 (GOWN DISPOSABLE) ×2 IMPLANT
GOWN STRL SURGICAL XL XLNG (GOWN DISPOSABLE) IMPLANT
KIT PROCEDURE FLUENT (KITS) ×1 IMPLANT
KIT TURNOVER KIT B (KITS) ×1 IMPLANT
PACK VAGINAL MINOR WOMEN LF (CUSTOM PROCEDURE TRAY) ×1 IMPLANT
PAD OB MATERNITY 11 LF (PERSONAL CARE ITEMS) ×1 IMPLANT
SEAL ROD LENS SCOPE MYOSURE (ABLATOR) ×1 IMPLANT
SOL .9 NS 3000ML IRR UROMATIC (IV SOLUTION) IMPLANT
SOL PREP POV-IOD 4OZ 10% (MISCELLANEOUS) IMPLANT
SPIKE FLUID TRANSFER (MISCELLANEOUS) IMPLANT
TOWEL GREEN STERILE FF (TOWEL DISPOSABLE) ×2 IMPLANT
UNDERPAD 30X36 HEAVY ABSORB (UNDERPADS AND DIAPERS) ×1 IMPLANT

## 2023-07-29 NOTE — Anesthesia Preprocedure Evaluation (Addendum)
 Anesthesia Evaluation  Patient identified by MRN, date of birth, ID band Patient awake    Reviewed: Allergy & Precautions, NPO status , Patient's Chart, lab work & pertinent test results  Airway Mallampati: II  TM Distance: >3 FB Neck ROM: Full    Dental no notable dental hx. (+) Teeth Intact, Dental Advisory Given   Pulmonary neg pulmonary ROS   Pulmonary exam normal breath sounds clear to auscultation       Cardiovascular negative cardio ROS Normal cardiovascular exam Rhythm:Regular Rate:Normal     Neuro/Psych negative neurological ROS  negative psych ROS   GI/Hepatic negative GI ROS, Neg liver ROS,,,  Endo/Other  diabetes (pre-diabetes)    Renal/GU negative Renal ROS  negative genitourinary   Musculoskeletal negative musculoskeletal ROS (+)    Abdominal   Peds  Hematology negative hematology ROS (+)   Anesthesia Other Findings   Reproductive/Obstetrics                             Anesthesia Physical Anesthesia Plan  ASA: 2  Anesthesia Plan: General   Post-op Pain Management: Tylenol  PO (pre-op)*   Induction: Intravenous  PONV Risk Score and Plan: 3 and Ondansetron , Dexamethasone  and Midazolam   Airway Management Planned: LMA  Additional Equipment:   Intra-op Plan:   Post-operative Plan: Extubation in OR  Informed Consent: I have reviewed the patients History and Physical, chart, labs and discussed the procedure including the risks, benefits and alternatives for the proposed anesthesia with the patient or authorized representative who has indicated his/her understanding and acceptance.     Dental advisory given  Plan Discussed with: CRNA  Anesthesia Plan Comments:        Anesthesia Quick Evaluation

## 2023-07-29 NOTE — Transfer of Care (Signed)
 Immediate Anesthesia Transfer of Care Note  Patient: Haley Luna  Procedure(s) Performed: DILATATION & CURETTAGE/HYSTEROSCOPY WITH MYOSURE RESECTION (Vagina )  Patient Location: PACU  Anesthesia Type:General  Level of Consciousness: awake, alert , oriented, and drowsy  Airway & Oxygen Therapy: Patient Spontanous Breathing and Patient connected to face mask oxygen  Post-op Assessment: Report given to RN and Post -op Vital signs reviewed and stable  Post vital signs: Reviewed and stable  Last Vitals:  Vitals Value Taken Time  BP 129/78 07/29/23 1700  Temp 36.5 C 07/29/23 1654  Pulse 75 07/29/23 1703  Resp 17 07/29/23 1703  SpO2 94 % 07/29/23 1703  Vitals shown include unfiled device data.  Last Pain:  Vitals:   07/29/23 1426  PainSc: 0-No pain      Patients Stated Pain Goal: 0 (07/29/23 1426)  Complications: No notable events documented.

## 2023-07-29 NOTE — Brief Op Note (Signed)
 07/29/2023  4:56 PM  PATIENT:  Haley Luna  68 y.o. female  PRE-OPERATIVE DIAGNOSIS:  postmenopausal Bleeding  POST-OPERATIVE DIAGNOSIS:  postmenopausal Bleeding  PROCEDURE:  Procedure(s) with comments: DILATATION & CURETTAGE/HYSTEROSCOPY WITH MYOSURE RESECTION (N/A) - possible myosure  SURGEON:  Surgeons and Role:    * Bovard-Stuckert, Denham Mose, MD - Primary  ANESTHESIA:   local and general by LMA  EBL:  20 mL IVF anf uop per anesthesia  DRAINS: none   LOCAL MEDICATIONS USED:  LIDOCAINE    SPECIMEN:  Source of Specimen:  endometrial currettings  DISPOSITION OF SPECIMEN:  PATHOLOGY  COUNTS:  YES  TOURNIQUET:  * No tourniquets in log *  DICTATION: .Other Dictation: Dictation Number [id number]  PLAN OF CARE: Discharge to home after PACU  PATIENT DISPOSITION:  PACU - hemodynamically stable.   Delay start of Pharmacological VTE agent (>24hrs) due to surgical blood loss or risk of bleeding: not applicable

## 2023-07-29 NOTE — Anesthesia Procedure Notes (Signed)
 Procedure Name: LMA Insertion Date/Time: 07/29/2023 4:02 PM  Performed by: Alphia Jasmine, CRNAPre-anesthesia Checklist: Patient identified, Emergency Drugs available, Suction available, Timeout performed and Patient being monitored Patient Re-evaluated:Patient Re-evaluated prior to induction Oxygen Delivery Method: Circle system utilized Preoxygenation: Pre-oxygenation with 100% oxygen Induction Type: IV induction Ventilation: Mask ventilation without difficulty LMA: LMA inserted LMA Size: 4.0 Tube type: Oral Number of attempts: 1 Placement Confirmation: positive ETCO2, CO2 detector and breath sounds checked- equal and bilateral Tube secured with: Tape Dental Injury: Teeth and Oropharynx as per pre-operative assessment

## 2023-07-29 NOTE — Interval H&P Note (Signed)
 History and Physical Interval Note:  07/29/2023 3:50 PM  Haley Luna  has presented today for surgery, with the diagnosis of postmenopausal Bleeding.  The various methods of treatment have been discussed with the patient and family. After consideration of risks, benefits and other options for treatment, the patient has consented to  Procedure(s) with comments: DILATATION & CURETTAGE/HYSTEROSCOPY WITH POSSIBLE MYOSURE RESECTION (N/A) - possible myosure as a surgical intervention.  The patient's history has been reviewed, patient examined, no change in status, stable for surgery.  I have reviewed the patient's chart and labs.  Questions were answered to the patient's satisfaction.     Katrinka Herbison Bovard-Stuckert

## 2023-07-30 ENCOUNTER — Encounter (HOSPITAL_COMMUNITY): Payer: Self-pay | Admitting: Obstetrics and Gynecology

## 2023-07-30 NOTE — Op Note (Signed)
 NAMETEARAH, Haley Luna MEDICAL RECORD NO: [id number] ACCOUNT NO: [id number] DATE OF BIRTH: July 13, 1955 FACILITY: [facility] LOCATION: [facility] PHYSICIAN: Margaretmary Shaver, MD  Operative Report   DATE OF PROCEDURE: 07/29/2023  PREOPERATIVE DIAGNOSES:  Postmenopausal bleeding, thickened endometrial stripe.  POSTOPERATIVE DIAGNOSES:  Postmenopausal bleeding, thickened endometrial stripe.  PROCEDURE:  Hysteroscopy, D and C with MyoSure resection of likely polyp vs fibroid.  SURGEON:  Margaretmary Shaver, MD.  ASSISTANT:  None.  ANESTHESIA:  Local and general by LMA.  ESTIMATED BLOOD LOSS:  20 mL.  IV FLUIDS AND URINE OUTPUT:  Per anesthesia.  COMPLICATIONS:  None.  PATHOLOGY:  Endometrial curettings, likely polyp/fibroid.  DESCRIPTION OF PROCEDURE:  After informed consent was reviewed with the patient, including risks, benefits, and alternatives of the surgical procedure, the patient was transported to the operating room and placed on the table in the supine position.   General anesthesia was induced and found to be adequate.  She was then prepped and draped in normal sterile fashion.  She was placed in the [place] stirrups.  After an appropriate timeout was performed, an open-sided speculum was placed in her vagina.   Her cervix was easily visualized and dilated to accommodate the hysteroscope.  The hysteroscope was introduced.  The cavity was found to be mostly obliterated by a polyp versus a fibroid.  The MyoSure device was used to resect this tissue.   Following the procedure, the thought was that both ostia were visualized. The tissue was somewhat fluffy.  A sharp curettage was also performed.  Instruments were removed from the vagina.  The patient tolerated the procedure well.  Sponge, lap, and  needle counts were correct x 2 per the operating room staff.   NIK D: 07/29/2023 5:03:18 pm T: 07/30/2023 3:02:00 am  JOB: [id number]/ [id number]

## 2023-07-31 LAB — SURGICAL PATHOLOGY

## 2023-07-31 NOTE — Anesthesia Postprocedure Evaluation (Signed)
 Anesthesia Post Note  Patient: Haley Luna  Procedure(s) Performed: DILATATION & CURETTAGE/HYSTEROSCOPY WITH MYOSURE RESECTION (Vagina )     Patient location during evaluation: PACU Anesthesia Type: General Level of consciousness: awake and alert Pain management: pain level controlled Vital Signs Assessment: post-procedure vital signs reviewed and stable Respiratory status: spontaneous breathing, nonlabored ventilation, respiratory function stable and patient connected to nasal cannula oxygen Cardiovascular status: blood pressure returned to baseline and stable Postop Assessment: no apparent nausea or vomiting Anesthetic complications: no  No notable events documented.  Last Vitals:  Vitals:   07/29/23 1715 07/29/23 1730  BP: 135/79 (!) 141/76  Pulse: 73 65  Resp: 16 17  Temp:  36.5 C  SpO2: 95% 97%    Last Pain:  Vitals:   07/29/23 1730  PainSc: 0-No pain   Pain Goal: Patients Stated Pain Goal: 0 (07/29/23 1426)                 Halden Phegley L Katheryne Gorr

## 2023-08-01 ENCOUNTER — Telehealth: Payer: Self-pay | Admitting: *Deleted

## 2023-08-01 NOTE — Telephone Encounter (Signed)
 Spoke with the patient regarding the referral to GYN oncology. Patient scheduled as new patient with Dr Daisey Dryer on 4/28 at 9:45 am.  Patient given an arrival time of 9:15 am.  Explained to the patient the the doctor will perform a pelvic exam at this visit. Patient given the policy that only one visitor allowed and that visitor must be over 16 yrs are allowed in the Cancer Center. Patient given the address/phone number for the clinic and that the center offers free valet service. Patient aware that masks required.

## 2023-08-02 ENCOUNTER — Encounter: Payer: Self-pay | Admitting: Psychiatry

## 2023-08-05 ENCOUNTER — Encounter: Payer: Self-pay | Admitting: Psychiatry

## 2023-08-05 ENCOUNTER — Inpatient Hospital Stay: Attending: Psychiatry | Admitting: Psychiatry

## 2023-08-05 ENCOUNTER — Inpatient Hospital Stay (HOSPITAL_BASED_OUTPATIENT_CLINIC_OR_DEPARTMENT_OTHER): Admitting: Gynecologic Oncology

## 2023-08-05 VITALS — BP 139/87 | HR 66 | Temp 98.3°F | Resp 16 | Ht 65.12 in | Wt 186.6 lb

## 2023-08-05 DIAGNOSIS — C541 Malignant neoplasm of endometrium: Secondary | ICD-10-CM | POA: Insufficient documentation

## 2023-08-05 DIAGNOSIS — R7303 Prediabetes: Secondary | ICD-10-CM | POA: Diagnosis not present

## 2023-08-05 DIAGNOSIS — E669 Obesity, unspecified: Secondary | ICD-10-CM | POA: Insufficient documentation

## 2023-08-05 MED ORDER — SENNOSIDES-DOCUSATE SODIUM 8.6-50 MG PO TABS: 2.0000 | ORAL_TABLET | Freq: Every day | ORAL | 0 refills | Status: AC

## 2023-08-05 MED ORDER — OXYCODONE HCL 5 MG PO TABS
5.0000 mg | ORAL_TABLET | ORAL | 0 refills | Status: DC | PRN
Start: 2023-08-05 — End: 2023-09-09

## 2023-08-05 NOTE — H&P (View-Only) (Signed)
 GYNECOLOGIC ONCOLOGY NEW PATIENT CONSULTATION  Date of Service: 08/05/2023 Referring Provider: Margaretmary Shaver, MD   ASSESSMENT AND PLAN: Haley Luna is a 68 y.o. woman with FIGO grade 1 endometrioid endometrial cancer.  We reviewed the nature of endometrial cancer and its recommended surgical staging, including total hysterectomy, bilateral salpingo-oophorectomy, and lymph node assessment. The patient is a suitable candidate for staging via a minimally invasive approach to surgery.  We reviewed that robotic assistance would be used to complete the surgery. We discussed that most endometrial cancer is detected early and that decisions regarding adjuvant therapy will be made based on her final pathology.   We reviewed the sentinel lymph node technique. Risks and benefits of sentinel lymph node biopsy was reviewed. We reviewed the technique and ICG dye. The patient DOES NOT have an iodine  allergy or known liver dysfunction. We reviewed the false negative rate (0.4%), and that 3% of patients with metastatic disease will not have it detected by SLN biopsy in endometrial cancer. A low risk of allergic reaction to the dye, <0.2% for ICG, has been reported. We also discussed that in the case of failed mapping, which occurs 40% of the time, a bilateral or unilateral lymphadenectomy will be performed at the surgeon's discretion.   Potential benefits of sentinel nodes including a higher detection rate for metastasis due to ultrastaging and potential reduction in operative morbidity. However, there remains uncertainty as to the role for treatment of micrometastatic disease. Further, the benefit of operative morbidity associated with the SLN technique in endometrial cancer is not yet completely known. In other patient populations (e.g. the cervical cancer population) there has been observed reductions in morbidity with SLN biopsy compared to pelvic lymphadenectomy. Lymphedema, nerve dysfunction and  lymphocysts are all potential risks with the SLN technique as with complete lymphadenectomy. Additional risks to the patient include the risk of damage to an internal organ while operating in an altered view (e.g. the black and white image of the robotic fluorescence imaging mode).   Patient was consented for: Robotic assisted total laparoscopic hysterectomy, bilateral salpingo-oophorectomy, sentinel lymph node evaluation and biopsy, possible lymph node dissection on 09/03/23.  The risks of surgery were discussed in detail and she understands these to including but not limited to bleeding requiring a blood transfusion, infection, injury to adjacent organs (including but not limited to the bowels, bladder, ureters, nerves, blood vessels), thromboembolic events, wound separation, hernia, vaginal cuff separation, possible risk of lymphedema and lymphocyst if lymphadenectomy performed, unforseen complication, and possible need for re-exploration.  If the patient experiences any of these events, she understands that her hospitalization or recovery may be prolonged and that she may need to take additional medications for a prolonged period. The patient will receive DVT and antibiotic prophylaxis as indicated. She voiced a clear understanding. She had the opportunity to ask questions and informed consent was obtained today. She wishes to proceed.  We discussed that I do not have any sooner dates available in [place]. Dr. Orvil Bland does not have any sooner dates in [place]. I will explore if any sooner dates available with Dr. Orvil Bland in [place]. Otherwise, we discussed that if a sooner date became available, we would try to move her up.  She does not require preoperative clearance. Her METs are >4.  All preoperative instructions were reviewed. Postoperative expectations were also reviewed. Written handouts were provided to the patient.   A copy of this note was sent to the patient's referring  provider.  Derrel Flies,  MD Gynecologic Oncology   Medical Decision Making I personally spent  TOTAL 60 minutes face-to-face and non-face-to-face in the care of this patient, which includes all pre, intra, and post visit time on the date of service.   ------------  CC: Endometrial cancer  HISTORY OF PRESENT ILLNESS:  Haley Luna is a 68 y.o. woman who is seen in consultation at the request of Margaretmary Shaver, MD for evaluation of FIGO grade 1 endometrial cancer.  Patient was being evaluated by her OB/GYN for postmenopausal bleeding and thickened endometrial stripe.  She had undergone an ultrasound 06/27/2023 which showed an endometrial thickness of 35.85 mm.  An endometrial biopsy at that time showed strips of inactive endometrial lining.  She was subsequently recommended to undergo hysteroscopy D&C with final pathology showing FIGO grade 1 endometrioid adenocarcinoma, suspicious for myometrial invasion.  Today patient presents with her sister.  Patient reports bleeding started likely around September 2024 and had a total of 4 episodes of bleeding.  Since her D&C she has had some light bleeding that is slowing down.  She also notes a little bit of early satiety but not a lot.  Otherwise she denies abdominal bloating, significant weight loss, change in bowel or bladder habits.     PAST MEDICAL HISTORY: Past Medical History:  Diagnosis Date   Allergy    Obesity    Pre-diabetes    A1C 6.1 as of 07/10/23.    PAST SURGICAL HISTORY: Past Surgical History:  Procedure Laterality Date   BUNIONECTOMY Right    DILATATION & CURRETTAGE/HYSTEROSCOPY WITH RESECTOCOPE N/A 07/29/2023   Procedure: DILATATION & CURETTAGE/HYSTEROSCOPY WITH MYOSURE RESECTION;  Surgeon: Margaretmary Shaver, MD;  Location: [facility] OR;  Service: Gynecology;  Laterality: N/A;  possible myosure   LYMPH NODE BIOPSY     age 62, benign   TONSILLECTOMY     age 62    OB/GYN HISTORY: OB History  Gravida Para  Term Preterm AB Living  2 2 2   2   SAB IAB Ectopic Multiple Live Births      2    # Outcome Date GA Lbr Len/2nd Weight Sex Type Anes PTL Lv  2 Term      Vag-Spont   LIV  1 Term      Vag-Spont   LIV      Age at menarche: 31 Age at menopause: 69 Hx of HRT: no Hx of STI: no Last pap: 06/26/23 NILM, HPVHR neg History of abnormal pap smears: no  SCREENING STUDIES:  Last mammogram: scheduled for 08/07/23 (last 2 years ago) Last colonoscopy: 2025 cologuard (no colonoscopy ever)  MEDICATIONS:  Current Outpatient Medications:    co-enzyme Q-10 30 MG capsule, Take 30 mg by mouth daily., Disp: , Rfl:    ibuprofen (ADVIL) 800 MG tablet, TAKE 1 TABLET 3 TIMES A DAY BY ORAL ROUTE AS NEEDED, FOR MODERATE PAIN., Disp: , Rfl:    Omega-3 Fatty Acids (FISH OIL PO), Take 1 capsule by mouth at bedtime. , Disp: , Rfl:   ALLERGIES: Allergies  Allergen Reactions   Penicillins Other (See Comments)    Reaction as a child. Told she almost died. Did it involve swelling of the face/tongue/throat, SOB, or low BP? Yes Did it involve sudden or severe rash/hives, skin peeling, or any reaction on the inside of your mouth or nose? Yes Did you need to seek medical attention at a hospital or doctor's office? Yes When did it last happen?       If all above  answers are "NO", may proceed with cephalosporin use.    Poison Ivy Extract Hives   Short Ragweed Pollen Ext Hives   Erythromycin Nausea And Vomiting and Rash   Declomycin [Demeclocycline] Rash   Milk (Cow) Diarrhea and Rash    FAMILY HISTORY: Family History  Problem Relation Age of Onset   Heart disease Mother    Heart attack Mother 48   Miscarriages / [place] Mother    Hearing loss Father    Cancer Sister        GTN   Depression Maternal Grandmother    Diabetes Paternal Grandmother    Mental illness Paternal Grandmother    Alcohol abuse Paternal Grandfather    Breast cancer Neg Hx    Endometrial cancer Neg Hx    Ovarian cancer Neg Hx     Hypertension Neg Hx    Prostate cancer Neg Hx    Colon cancer Neg Hx    Pancreatic cancer Neg Hx     SOCIAL HISTORY: Social History   Socioeconomic History   Marital status: Widowed    Spouse name: Not on file   Number of children: Not on file   Years of education: Not on file   Highest education level: Not on file  Occupational History   Not on file  Tobacco Use   Smoking status: Never   Smokeless tobacco: Never  Vaping Use   Vaping status: Never Used  Substance and Sexual Activity   Alcohol use: Yes    Comment: occ   Drug use: No   Sexual activity: Not Currently    Birth control/protection: Post-menopausal  Other Topics Concern   Not on file  Social History Narrative   Not on file   Social Drivers of Health   Financial Resource Strain: Not on file  Food Insecurity: No Food Insecurity (08/02/2023)   Hunger Vital Sign    Worried About Running Out of Food in the Last Year: Never true    Ran Out of Food in the Last Year: Never true  Transportation Needs: No Transportation Needs (08/02/2023)   PRAPARE - Administrator, Civil Service (Medical): No    Lack of Transportation (Non-Medical): No  Physical Activity: Not on file  Stress: Not on file  Social Connections: Unknown (08/18/2021)   [facility]   Social Network    Social Network: Not on file  Intimate Partner Violence: Not At Risk (08/02/2023)   Humiliation, Afraid, Rape, and Kick questionnaire    Fear of Current or Ex-Partner: No    Emotionally Abused: No    Physically Abused: No    Sexually Abused: No    REVIEW OF SYSTEMS: New patient intake form was reviewed.  Complete 10-system review is negative except for the following: vaginal bleeding, vaginal discharge  PHYSICAL EXAM: BP 139/87 (BP Location: Left Arm, Patient Position: Sitting)   Pulse 66   Temp 98.3 F (36.8 C) (Oral)   Resp 16   Ht 5' 5.12" (1.654 m)   Wt 186 lb 9.6 oz (84.6 kg)   SpO2 100%   BMI  30.94 kg/m  Constitutional: No acute distress. Neuro/Psych: Alert, oriented.  Head and Neck: Normocephalic, atraumatic. Neck symmetric without masses. Sclera anicteric.  Respiratory: Normal work of breathing. Clear to auscultation bilaterally. Cardiovascular: Regular rate and rhythm, no murmurs, rubs, or gallops. Abdomen: Normoactive bowel sounds. Soft, non-distended, non-tender to palpation. No masses appreciated.  Extremities: Grossly normal range of motion. Warm, well perfused. No edema  bilaterally. Skin: No rashes or lesions. Lymphatic: No cervical, supraclavicular, or inguinal adenopathy. Genitourinary: External genitalia without lesions. Urethral meatus without lesions or prolapse. On speculum exam, vagina and cervix without lesions. Bimanual exam reveals normal cervix and mobile uterus. Exam chaperoned by Vira Grieves, NP   LABORATORY AND RADIOLOGIC DATA: Outside medical records were reviewed to synthesize the above history, along with the history and physical obtained during the visit.  Outside laboratory, pathology, and imaging reports were reviewed, with pertinent results below.   WBC  Date Value Ref Range Status  07/10/2023 5.0 3.4 - 10.8 x10E3/uL Final  09/08/2018 6.6 4.0 - 10.5 K/uL Final   Hemoglobin  Date Value Ref Range Status  07/10/2023 13.2 11.1 - 15.9 g/dL Final   Hematocrit  Date Value Ref Range Status  07/10/2023 39.5 34.0 - 46.6 % Final   Platelets  Date Value Ref Range Status  07/10/2023 276 150 - 450 x10E3/uL Final   Creatinine, Ser  Date Value Ref Range Status  07/10/2023 0.85 0.57 - 1.00 mg/dL Final   AST  Date Value Ref Range Status  07/10/2023 22 0 - 40 IU/L Final   ALT  Date Value Ref Range Status  07/10/2023 23 0 - 32 IU/L Final   Ultrasound (07/01/23): Uterus: 8.99 x 6.03 x 4.94 cm Endometrial thickness: 35.85 mm  Surgical pathology (07/29/23): A. ENDOMETRIUM AND POSSIBLE FIBROID, CURETTAGE:  Well-differentiated endometrioid  adenocarcinoma (FIGO I)  Suspicious for myometrial invasion

## 2023-08-05 NOTE — Patient Instructions (Signed)
 Preparing for your Surgery  Plan for surgery on Sep 03, 2023 with Dr. Derrel Flies at [facility]. You will be scheduled for robotic assisted total laparoscopic hysterectomy (removal of the uterus and cervix), bilateral salpingo-oophorectomy (removal of both ovaries and fallopian tubes), sentinel lymph node biopsy, possible lymph node dissection, possible laparotomy (larger incision on your abdomen if needed).   Pre-operative Testing -You will receive a phone call from presurgical testing at [facility] to arrange for a pre-operative appointment and lab work.  -Bring your insurance card, copy of an advanced directive if applicable, medication list  -At that visit, you will be asked to sign a consent for a possible blood transfusion in case a transfusion becomes necessary during surgery.  The need for a blood transfusion is rare but having consent is a necessary part of your care.     -You should not be taking blood thinners or aspirin  at least ten days prior to surgery unless instructed by your surgeon.  -Do not take supplements such as fish oil (omega 3), red yeast rice, turmeric before your surgery. STOP TAKING AT LEAST 10 DAYS BEFORE SURGERY. You want to avoid medications with aspirin  in them including headache powders such as BC or Goody's), Excedrin migraine.  Day Before Surgery at Home -You will be asked to take in a light diet the day before surgery. You will be advised you can have clear liquids up until 3 hours before your surgery.    Eat a light diet the day before surgery.  Examples including soups, broths, toast, yogurt, mashed potatoes.  AVOID GAS PRODUCING FOODS AND BEVERAGES. Things to avoid include carbonated beverages (fizzy beverages, sodas), raw fruits and raw vegetables (uncooked), or beans.   If your bowels are filled with gas, your surgeon will have difficulty visualizing your pelvic organs which increases your surgical risks.  Your role in  recovery Your role is to become active as soon as directed by your doctor, while still giving yourself time to heal.  Rest when you feel tired. You will be asked to do the following in order to speed your recovery:  - Cough and breathe deeply. This helps to clear and expand your lungs and can prevent pneumonia after surgery.  - STAY ACTIVE WHEN YOU GET HOME. Do mild physical activity. Walking or moving your legs help your circulation and body functions return to normal. Do not try to get up or walk alone the first time after surgery.   -If you develop swelling on one leg or the other, pain in the back of your leg, redness/warmth in one of your legs, please call the office or go to the Emergency Room to have a doppler to rule out a blood clot. For shortness of breath, chest pain-seek care in the Emergency Room as soon as possible. - Actively manage your pain. Managing your pain lets you move in comfort. We will ask you to rate your pain on a scale of zero to 10. It is your responsibility to tell your doctor or nurse where and how much you hurt so your pain can be treated.  Special Considerations -If you are diabetic, you may be placed on insulin after surgery to have closer control over your blood sugars to promote healing and recovery.  This does not mean that you will be discharged on insulin.  If applicable, your oral antidiabetics will be resumed when you are tolerating a solid diet.  -Your final pathology results from surgery should be  available around one week after surgery and the results will be relayed to you when available.  -Dr. Beatris Bough is the surgeon that assists your GYN Oncologist with surgery.  If you end up staying the night, the next day after your surgery you will either see Dr. Orvil Bland, Dr. Daisey Dryer, or Dr. Abdul Hodgkin.  -FMLA forms can be faxed to [phone] and please allow 5-7 business days for completion.  Pain Management After Surgery -You will be prescribed  your pain medication and bowel regimen medications before surgery so that you can have these available when you are discharged from the hospital. The pain medication is for use ONLY AFTER surgery and a new prescription will not be given.   -Make sure that you have Tylenol  and Ibuprofen IF YOU ARE ABLE TO TAKE THESE MEDICATIONS at home to use on a regular basis after surgery for pain control. We recommend alternating the medications every hour to six hours since they work differently and are processed in the body differently for pain relief.  -Review the attached handout on narcotic use and their risks and side effects.   Bowel Regimen -You will be prescribed Sennakot-S to take nightly to prevent constipation especially if you are taking the narcotic pain medication intermittently.  It is important to prevent constipation and drink adequate amounts of liquids. You can stop taking this medication when you are not taking pain medication and you are back on your normal bowel routine.  Risks of Surgery Risks of surgery are low but include bleeding, infection, damage to surrounding structures, re-operation, blood clots, and very rarely death.   Blood Transfusion Information (For the consent to be signed before surgery)  We will be checking your blood type before surgery so in case of emergencies, we will know what type of blood you would need.                                            WHAT IS A BLOOD TRANSFUSION?  A transfusion is the replacement of blood or some of its parts. Blood is made up of multiple cells which provide different functions. Red blood cells carry oxygen and are used for blood loss replacement. White blood cells fight against infection. Platelets control bleeding. Plasma helps clot blood. Other blood products are available for specialized needs, such as hemophilia or other clotting disorders. BEFORE THE TRANSFUSION  Who gives blood for transfusions?  You may be able to  donate blood to be used at a later date on yourself (autologous donation). Relatives can be asked to donate blood. This is generally not any safer than if you have received blood from a stranger. The same precautions are taken to ensure safety when a relative's blood is donated. Healthy volunteers who are fully evaluated to make sure their blood is safe. This is blood bank blood. Transfusion therapy is the safest it has ever been in the practice of medicine. Before blood is taken from a donor, a complete history is taken to make sure that person has no history of diseases nor engages in risky social behavior (examples are intravenous drug use or sexual activity with multiple partners). The donor's travel history is screened to minimize risk of transmitting infections, such as malaria. The donated blood is tested for signs of infectious diseases, such as HIV and hepatitis. The blood is then tested to be sure it  is compatible with you in order to minimize the chance of a transfusion reaction. If you or a relative donates blood, this is often done in anticipation of surgery and is not appropriate for emergency situations. It takes many days to process the donated blood. RISKS AND COMPLICATIONS Although transfusion therapy is very safe and saves many lives, the main dangers of transfusion include:  Getting an infectious disease. Developing a transfusion reaction. This is an allergic reaction to something in the blood you were given. Every precaution is taken to prevent this. The decision to have a blood transfusion has been considered carefully by your caregiver before blood is given. Blood is not given unless the benefits outweigh the risks.  AFTER SURGERY INSTRUCTIONS  Return to work: 4-6 weeks if applicable  Activity: 1. Be up and out of the bed during the day.  Take a nap if needed.  You may walk up steps but be careful and use the hand rail.  Stair climbing will tire you more than you think, you may  need to stop part way and rest.   2. No lifting or straining for 6 weeks over 10 pounds. No pushing, pulling, straining for 6 weeks.  3. No driving for 9-60 days when the following criteria have been met: Do not drive if you are taking narcotic pain medicine and make sure that your reaction time has returned.   4. You can shower as soon as the next day after surgery. Shower daily.  Use your regular soap and water (not directly on the incision) and pat your incision(s) dry afterwards; don't rub.  No tub baths or submerging your body in water until cleared by your surgeon. If you have the soap that was given to you by pre-surgical testing that was used before surgery, you do not need to use it afterwards because this can irritate your incisions.   5. No sexual activity and nothing in the vagina for 12 weeks.  6. You may experience a small amount of clear drainage from your incisions, which is normal.  If the drainage persists, increases, or changes color please call the office.  7. Do not use creams, lotions, or ointments such as neosporin on your incisions after surgery until advised by your surgeon because they can cause removal of the dermabond glue on your incisions.    8. You may experience vaginal spotting after surgery or when the stitches at the top of the vagina begin to dissolve.  The spotting is normal but if you experience heavy bleeding, call our office.  9. Take Tylenol  or ibuprofen first for pain if you are able to take these medications and only use narcotic pain medication for severe pain not relieved by the Tylenol  or Ibuprofen.  Monitor your Tylenol  intake to a max of 4,000 mg in a 24 hour period. You can alternate these medications after surgery.  Diet: 1. Low sodium Heart Healthy Diet is recommended but you are cleared to resume your normal (before surgery) diet after your procedure.  2. It is safe to use a laxative, such as Miralax or Colace, if you have difficulty moving  your bowels before surgery. You have been prescribed Sennakot-S to take at bedtime every evening after surgery to keep bowel movements regular and to prevent constipation.    Wound Care: 1. Keep clean and dry.  Shower daily.  Reasons to call the Doctor: Fever - Oral temperature greater than 100.4 degrees Fahrenheit Foul-smelling vaginal discharge Difficulty urinating Nausea and vomiting  Increased pain at the site of the incision that is unrelieved with pain medicine. Difficulty breathing with or without chest pain New calf pain especially if only on one side Sudden, continuing increased vaginal bleeding with or without clots.   Contacts: For questions or concerns you should contact:  Dr. Derrel Flies at [phone]  Vira Grieves, NP at [phone]  After Hours: call [phone] and have the GYN Oncologist paged/contacted (after 5 pm or on the weekends). You will speak with an after hours RN and let he or she know you have had surgery.  Messages sent via mychart are for non-urgent matters and are not responded to after hours so for urgent needs, please call the after hours number.

## 2023-08-05 NOTE — Progress Notes (Signed)
 GYNECOLOGIC ONCOLOGY NEW PATIENT CONSULTATION  Date of Service: 08/05/2023 Referring Provider: Margaretmary Shaver, MD   ASSESSMENT AND PLAN: Haley Luna is a 68 y.o. woman with FIGO grade 1 endometrioid endometrial cancer.  We reviewed the nature of endometrial cancer and its recommended surgical staging, including total hysterectomy, bilateral salpingo-oophorectomy, and lymph node assessment. The patient is a suitable candidate for staging via a minimally invasive approach to surgery.  We reviewed that robotic assistance would be used to complete the surgery. We discussed that most endometrial cancer is detected early and that decisions regarding adjuvant therapy will be made based on her final pathology.   We reviewed the sentinel lymph node technique. Risks and benefits of sentinel lymph node biopsy was reviewed. We reviewed the technique and ICG dye. The patient DOES NOT have an iodine  allergy or known liver dysfunction. We reviewed the false negative rate (0.4%), and that 3% of patients with metastatic disease will not have it detected by SLN biopsy in endometrial cancer. A low risk of allergic reaction to the dye, <0.2% for ICG, has been reported. We also discussed that in the case of failed mapping, which occurs 40% of the time, a bilateral or unilateral lymphadenectomy will be performed at the surgeon's discretion.   Potential benefits of sentinel nodes including a higher detection rate for metastasis due to ultrastaging and potential reduction in operative morbidity. However, there remains uncertainty as to the role for treatment of micrometastatic disease. Further, the benefit of operative morbidity associated with the SLN technique in endometrial cancer is not yet completely known. In other patient populations (e.g. the cervical cancer population) there has been observed reductions in morbidity with SLN biopsy compared to pelvic lymphadenectomy. Lymphedema, nerve dysfunction and  lymphocysts are all potential risks with the SLN technique as with complete lymphadenectomy. Additional risks to the patient include the risk of damage to an internal organ while operating in an altered view (e.g. the black and white image of the robotic fluorescence imaging mode).   Patient was consented for: Robotic assisted total laparoscopic hysterectomy, bilateral salpingo-oophorectomy, sentinel lymph node evaluation and biopsy, possible lymph node dissection on 09/03/23.  The risks of surgery were discussed in detail and she understands these to including but not limited to bleeding requiring a blood transfusion, infection, injury to adjacent organs (including but not limited to the bowels, bladder, ureters, nerves, blood vessels), thromboembolic events, wound separation, hernia, vaginal cuff separation, possible risk of lymphedema and lymphocyst if lymphadenectomy performed, unforseen complication, and possible need for re-exploration.  If the patient experiences any of these events, she understands that her hospitalization or recovery may be prolonged and that she may need to take additional medications for a prolonged period. The patient will receive DVT and antibiotic prophylaxis as indicated. She voiced a clear understanding. She had the opportunity to ask questions and informed consent was obtained today. She wishes to proceed.  We discussed that I do not have any sooner dates available in [place]. Dr. Orvil Bland does not have any sooner dates in [place]. I will explore if any sooner dates available with Dr. Orvil Bland in [place]. Otherwise, we discussed that if a sooner date became available, we would try to move her up.  She does not require preoperative clearance. Her METs are >4.  All preoperative instructions were reviewed. Postoperative expectations were also reviewed. Written handouts were provided to the patient.   A copy of this note was sent to the patient's referring  provider.  Derrel Flies,  MD Gynecologic Oncology   Medical Decision Making I personally spent  TOTAL 60 minutes face-to-face and non-face-to-face in the care of this patient, which includes all pre, intra, and post visit time on the date of service.   ------------  CC: Endometrial cancer  HISTORY OF PRESENT ILLNESS:  Haley Luna is a 68 y.o. woman who is seen in consultation at the request of Margaretmary Shaver, MD for evaluation of FIGO grade 1 endometrial cancer.  Patient was being evaluated by her OB/GYN for postmenopausal bleeding and thickened endometrial stripe.  She had undergone an ultrasound 06/27/2023 which showed an endometrial thickness of 35.85 mm.  An endometrial biopsy at that time showed strips of inactive endometrial lining.  She was subsequently recommended to undergo hysteroscopy D&C with final pathology showing FIGO grade 1 endometrioid adenocarcinoma, suspicious for myometrial invasion.  Today patient presents with her sister.  Patient reports bleeding started likely around September 2024 and had a total of 4 episodes of bleeding.  Since her D&C she has had some light bleeding that is slowing down.  She also notes a little bit of early satiety but not a lot.  Otherwise she denies abdominal bloating, significant weight loss, change in bowel or bladder habits.     PAST MEDICAL HISTORY: Past Medical History:  Diagnosis Date   Allergy    Obesity    Pre-diabetes    A1C 6.1 as of 07/10/23.    PAST SURGICAL HISTORY: Past Surgical History:  Procedure Laterality Date   BUNIONECTOMY Right    DILATATION & CURRETTAGE/HYSTEROSCOPY WITH RESECTOCOPE N/A 07/29/2023   Procedure: DILATATION & CURETTAGE/HYSTEROSCOPY WITH MYOSURE RESECTION;  Surgeon: Margaretmary Shaver, MD;  Location: [facility] OR;  Service: Gynecology;  Laterality: N/A;  possible myosure   LYMPH NODE BIOPSY     age 62, benign   TONSILLECTOMY     age 62    OB/GYN HISTORY: OB History  Gravida Para  Term Preterm AB Living  2 2 2   2   SAB IAB Ectopic Multiple Live Births      2    # Outcome Date GA Lbr Len/2nd Weight Sex Type Anes PTL Lv  2 Term      Vag-Spont   LIV  1 Term      Vag-Spont   LIV      Age at menarche: 31 Age at menopause: 69 Hx of HRT: no Hx of STI: no Last pap: 06/26/23 NILM, HPVHR neg History of abnormal pap smears: no  SCREENING STUDIES:  Last mammogram: scheduled for 08/07/23 (last 2 years ago) Last colonoscopy: 2025 cologuard (no colonoscopy ever)  MEDICATIONS:  Current Outpatient Medications:    co-enzyme Q-10 30 MG capsule, Take 30 mg by mouth daily., Disp: , Rfl:    ibuprofen (ADVIL) 800 MG tablet, TAKE 1 TABLET 3 TIMES A DAY BY ORAL ROUTE AS NEEDED, FOR MODERATE PAIN., Disp: , Rfl:    Omega-3 Fatty Acids (FISH OIL PO), Take 1 capsule by mouth at bedtime. , Disp: , Rfl:   ALLERGIES: Allergies  Allergen Reactions   Penicillins Other (See Comments)    Reaction as a child. Told she almost died. Did it involve swelling of the face/tongue/throat, SOB, or low BP? Yes Did it involve sudden or severe rash/hives, skin peeling, or any reaction on the inside of your mouth or nose? Yes Did you need to seek medical attention at a hospital or doctor's office? Yes When did it last happen?       If all above  answers are "NO", may proceed with cephalosporin use.    Poison Ivy Extract Hives   Short Ragweed Pollen Ext Hives   Erythromycin Nausea And Vomiting and Rash   Declomycin [Demeclocycline] Rash   Milk (Cow) Diarrhea and Rash    FAMILY HISTORY: Family History  Problem Relation Age of Onset   Heart disease Mother    Heart attack Mother 48   Miscarriages / [place] Mother    Hearing loss Father    Cancer Sister        GTN   Depression Maternal Grandmother    Diabetes Paternal Grandmother    Mental illness Paternal Grandmother    Alcohol abuse Paternal Grandfather    Breast cancer Neg Hx    Endometrial cancer Neg Hx    Ovarian cancer Neg Hx     Hypertension Neg Hx    Prostate cancer Neg Hx    Colon cancer Neg Hx    Pancreatic cancer Neg Hx     SOCIAL HISTORY: Social History   Socioeconomic History   Marital status: Widowed    Spouse name: Not on file   Number of children: Not on file   Years of education: Not on file   Highest education level: Not on file  Occupational History   Not on file  Tobacco Use   Smoking status: Never   Smokeless tobacco: Never  Vaping Use   Vaping status: Never Used  Substance and Sexual Activity   Alcohol use: Yes    Comment: occ   Drug use: No   Sexual activity: Not Currently    Birth control/protection: Post-menopausal  Other Topics Concern   Not on file  Social History Narrative   Not on file   Social Drivers of Health   Financial Resource Strain: Not on file  Food Insecurity: No Food Insecurity (08/02/2023)   Hunger Vital Sign    Worried About Running Out of Food in the Last Year: Never true    Ran Out of Food in the Last Year: Never true  Transportation Needs: No Transportation Needs (08/02/2023)   PRAPARE - Administrator, Civil Service (Medical): No    Lack of Transportation (Non-Medical): No  Physical Activity: Not on file  Stress: Not on file  Social Connections: Unknown (08/18/2021)   [facility]   Social Network    Social Network: Not on file  Intimate Partner Violence: Not At Risk (08/02/2023)   Humiliation, Afraid, Rape, and Kick questionnaire    Fear of Current or Ex-Partner: No    Emotionally Abused: No    Physically Abused: No    Sexually Abused: No    REVIEW OF SYSTEMS: New patient intake form was reviewed.  Complete 10-system review is negative except for the following: vaginal bleeding, vaginal discharge  PHYSICAL EXAM: BP 139/87 (BP Location: Left Arm, Patient Position: Sitting)   Pulse 66   Temp 98.3 F (36.8 C) (Oral)   Resp 16   Ht 5' 5.12" (1.654 m)   Wt 186 lb 9.6 oz (84.6 kg)   SpO2 100%   BMI  30.94 kg/m  Constitutional: No acute distress. Neuro/Psych: Alert, oriented.  Head and Neck: Normocephalic, atraumatic. Neck symmetric without masses. Sclera anicteric.  Respiratory: Normal work of breathing. Clear to auscultation bilaterally. Cardiovascular: Regular rate and rhythm, no murmurs, rubs, or gallops. Abdomen: Normoactive bowel sounds. Soft, non-distended, non-tender to palpation. No masses appreciated.  Extremities: Grossly normal range of motion. Warm, well perfused. No edema  bilaterally. Skin: No rashes or lesions. Lymphatic: No cervical, supraclavicular, or inguinal adenopathy. Genitourinary: External genitalia without lesions. Urethral meatus without lesions or prolapse. On speculum exam, vagina and cervix without lesions. Bimanual exam reveals normal cervix and mobile uterus. Exam chaperoned by Vira Grieves, NP   LABORATORY AND RADIOLOGIC DATA: Outside medical records were reviewed to synthesize the above history, along with the history and physical obtained during the visit.  Outside laboratory, pathology, and imaging reports were reviewed, with pertinent results below.   WBC  Date Value Ref Range Status  07/10/2023 5.0 3.4 - 10.8 x10E3/uL Final  09/08/2018 6.6 4.0 - 10.5 K/uL Final   Hemoglobin  Date Value Ref Range Status  07/10/2023 13.2 11.1 - 15.9 g/dL Final   Hematocrit  Date Value Ref Range Status  07/10/2023 39.5 34.0 - 46.6 % Final   Platelets  Date Value Ref Range Status  07/10/2023 276 150 - 450 x10E3/uL Final   Creatinine, Ser  Date Value Ref Range Status  07/10/2023 0.85 0.57 - 1.00 mg/dL Final   AST  Date Value Ref Range Status  07/10/2023 22 0 - 40 IU/L Final   ALT  Date Value Ref Range Status  07/10/2023 23 0 - 32 IU/L Final   Ultrasound (07/01/23): Uterus: 8.99 x 6.03 x 4.94 cm Endometrial thickness: 35.85 mm  Surgical pathology (07/29/23): A. ENDOMETRIUM AND POSSIBLE FIBROID, CURETTAGE:  Well-differentiated endometrioid  adenocarcinoma (FIGO I)  Suspicious for myometrial invasion

## 2023-08-07 ENCOUNTER — Ambulatory Visit
Admission: RE | Admit: 2023-08-07 | Discharge: 2023-08-07 | Disposition: A | Discharge status: Home / Self Care | Source: Ambulatory Visit | Attending: Family Medicine

## 2023-08-07 DIAGNOSIS — Z1231 Encounter for screening mammogram for malignant neoplasm of breast: Secondary | ICD-10-CM

## 2023-08-08 ENCOUNTER — Encounter: Payer: Self-pay | Admitting: Obstetrics and Gynecology

## 2023-08-08 ENCOUNTER — Telehealth: Payer: Self-pay | Admitting: *Deleted

## 2023-08-08 NOTE — Telephone Encounter (Signed)
-----   Message from Suellyn Emory sent at 08/08/2023  4:33 PM EDT ----- Please call the patient and let her know we have an opening with Dr. Orvil Bland for surgery on May 8. We would like to move her surgery up into this spot.

## 2023-08-08 NOTE — Telephone Encounter (Signed)
 Spoke with Haley Luna and relayed message from Vira Grieves, NP that Dr.Tucker has an opening for surgery on May 8th. And would pt like to mover her surgery to this spot? Pt agreed to move her surgery to May 8th and advised that Haley Luna would be reaching out to reschedule her pre-admission appt. Pt thanked the office for calling.

## 2023-08-12 NOTE — Progress Notes (Signed)
 COVID Vaccine received:  []  No [x]  Yes Date of any COVID positive Test in last 90 days:  PCP - Lugenia Said, FNP  Cardiologist -   Chest x-ray - 09-08-2018  2v  Epic EKG -  09-09-2018  will repeat Stress Test -  ECHO -  Cardiac Cath -  CT cardiac calcium score of 0 on 09-09-2018  Epic  Bowel Prep - [x]  No  []   Yes ___GYN Diet___  Pacemaker / ICD device [x]  No []  Yes   Spinal Cord Stimulator:[x]  No []  Yes       History of Sleep Apnea? [x]  No []  Yes   CPAP used?- [x]  No []  Yes    Does the patient monitor blood sugar?   []  N/A   []  No []  Yes  Patient has: []  NO Hx DM   [x]  Pre-DM   []  DM1  []   DM2 Last A1c was:  6.1  on   07-10-2023   Does patient have a [employer] or Dexcom? []  No []  Yes   Fasting Blood Sugar Ranges-  Checks Blood Sugar _____ times a day  Blood Thinner / Instructions: none Aspirin  Instructions:  none  ERAS Protocol Ordered: []  No  [x]  Yes PRE-SURGERY []  ENSURE  []  G2   [x]  No Drink Ordered Patient is to be NPO after: 0800  Dental hx: []  Dentures:  []  N/A      []  Bridge or Partial:                   []  Loose or Damaged teeth:   Comments:   Activity level: Patient is able / unable to climb a flight of stairs without difficulty; []  No CP  []  No SOB, but would have ___   Patient can / can not perform ADLs without assistance.   Anesthesia review: Pre-DM   no meds.   Patient denies shortness of breath, fever, cough and chest pain at PAT appointment.  Patient verbalized understanding and agreement to the Pre-Surgical Instructions that were given to them at this PAT appointment. Patient was also educated of the need to review these PAT instructions again prior to her surgery.I reviewed the appropriate phone numbers to call if they have any and questions or concerns.

## 2023-08-12 NOTE — Progress Notes (Signed)
 Patient here for new patient consultation with Dr. Daisey Dryer and for a pre-operative appointment prior to surgery. She is scheduled for a robotic assisted total laparoscopic hysterectomy, bilateral salpingo-oophorectomy, sentinel lymph node biopsy, possible lymph node dissection, possible laparotomy. The surgery was discussed in detail.  See after visit summary for additional details.     Discussed post-op pain management in detail including the aspects of the enhanced recovery pathway.  Advised her that a new prescription would be sent in for oxycodone  and it is only to be used for after her upcoming surgery.  We discussed the use of tylenol  post-op and to monitor for a maximum of 4,000 mg in a 24 hour period.  Also prescribed sennakot to be used after surgery and to hold if having loose stools.  Discussed bowel regimen in detail.     Discussed measures to take at home to prevent DVT including frequent mobility.  Reportable signs and symptoms of DVT discussed. Post-operative instructions discussed and expectations for after surgery. Incisional care discussed as well including reportable signs and symptoms including erythema, drainage, wound separation.     10 minutes spent preparing information with the patient.  Verbalizing understanding of material discussed. No needs or concerns voiced at the end of the visit.   Advised patient to call for any needs.  Advised that her post-operative medications had been prescribed and could be picked up at any time.    This appointment is included in the global surgical bundle as pre-operative teaching and has no charge.

## 2023-08-12 NOTE — Patient Instructions (Signed)
 SURGICAL WAITING ROOM VISITATION Patients having surgery or a procedure may have no more than 2 support people in the waiting area - these visitors may rotate in the visitor waiting room.   If the patient needs to stay at the hospital during part of their recovery, the visitor guidelines for inpatient rooms apply.  PRE-OP VISITATION  Pre-op nurse will coordinate an appropriate time for 1 support person to accompany the patient in pre-op.  This support person may not rotate.  This visitor will be contacted when the time is appropriate for the visitor to come back in the pre-op area.  Please refer to the [facility] website for the visitor guidelines for Inpatients (after your surgery is over and you are in a regular room).  You are not required to quarantine at this time prior to your surgery. However, you must do this: Hand Hygiene often Do NOT share personal items Notify your provider if you are in close contact with someone who has COVID or you develop fever 100.4 or greater, new onset of sneezing, cough, sore throat, shortness of breath or body aches.  If you test positive for Covid or have been in contact with anyone that has tested positive in the last 10 days please notify you surgeon.    Your procedure is scheduled on:  Thursday   Aug 15, 2023  Report to [facility] Main Entrance: [address] entrance where the [employer] is available.   Report to admitting at: 08:45 AM  Call this number if you have any questions or problems the morning of surgery [phone]  FOLLOW ANY ADDITIONAL PRE OP INSTRUCTIONS YOU RECEIVED FROM YOUR SURGEON'S OFFICE!!!  Eat a light diet the day before surgery.  Examples including soups, broths, toast, yogurt, mashed potatoes.  AVOID GAS PRODUCING FOODS. Things to avoid include carbonated beverages (fizzy beverages, sodas), raw fruits and raw vegetables (uncooked), or beans.    Do not eat food after Midnight the night prior to your  surgery/procedure.  After Midnight you may have the following liquids until   08:00 AM DAY OF SURGERY  Clear Liquid Diet Water Black Coffee (sugar ok, NO MILK/CREAM OR CREAMERS)  Tea (sugar ok, NO MILK/CREAM OR CREAMERS) regular and decaf                             Plain Jell-O  with no fruit (NO RED)                                           Fruit ices (not with fruit pulp, NO RED)                                     Popsicles (NO RED)                                                                  Juice: NO CITRUS JUICES: only apple, WHITE grape, WHITE cranberry Sports drinks like Gatorade or Powerade (NO RED)  Oral Hygiene is also important to reduce your risk of infection.        Remember - BRUSH YOUR TEETH THE MORNING OF SURGERY WITH YOUR REGULAR TOOTHPASTE  Do NOT smoke after Midnight the night before surgery.  STOP TAKING all Vitamins, Herbs and supplements 1 week before your surgery.   Take ONLY these medicines the morning of surgery with A SIP OF WATER: Tylenol  if needed for pain                   You may not have any metal on your body including hair pins, jewelry, and body piercing  Do not wear make-up, lotions, powders, perfumes or deodorant  Do not wear nail polish including gel and S&S, artificial / acrylic nails, or any other type of covering on natural nails including finger and toenails. If you have artificial nails, gel coating, etc., that needs to be removed by a nail salon, Please have this removed prior to surgery. Not doing so may mean that your surgery could be cancelled or delayed if the Surgeon or anesthesia staff feels like they are unable to monitor you safely.   Do not shave 48 hours prior to surgery to avoid nicks in your skin which may contribute to postoperative infections.   Contacts, Hearing Aids, dentures or bridgework may not be worn into surgery. DENTURES WILL BE REMOVED PRIOR TO SURGERY PLEASE DO NOT APPLY "Poly grip" OR  ADHESIVES!!!  Patients discharged on the day of surgery will not be allowed to drive home.  Someone NEEDS to stay with you for the first 24 hours after anesthesia.  Do not bring your home medications to the hospital. The Pharmacy will dispense medications listed on your medication list to you during your admission in the Hospital.  Please read over the following fact sheets you were given: IF YOU HAVE QUESTIONS ABOUT YOUR PRE-OP INSTRUCTIONS, PLEASE CALL [phone]   [facility] - Preparing for Surgery Before surgery, you can play an important role.  Because skin is not sterile, your skin needs to be as free of germs as possible.  You can reduce the number of germs on your skin by washing with CHG (chlorahexidine gluconate) soap before surgery.  CHG is an antiseptic cleaner which kills germs and bonds with the skin to continue killing germs even after washing. Please DO NOT use if you have an allergy to CHG or antibacterial soaps.  If your skin becomes reddened/irritated stop using the CHG and inform your nurse when you arrive at Short Stay. Do not shave (including legs and underarms) for at least 48 hours prior to the first CHG shower.  You may shave your face/neck.  Please follow these instructions carefully:  1.  Shower with CHG Soap the night before surgery and the  morning of surgery.  2.  If you choose to wash your hair, wash your hair first as usual with your normal  shampoo.  3.  After you shampoo, rinse your hair and body thoroughly to remove the shampoo.                             4.  Use CHG as you would any other liquid soap.  You can apply chg directly to the skin and wash.  Gently with a scrungie or clean washcloth.  5.  Apply the CHG Soap to your body ONLY FROM THE NECK DOWN.   Do not use on face/  open                           Wound or open sores. Avoid contact with eyes, ears mouth and genitals (private parts).                       Wash face,  Genitals (private parts) with  your normal soap.             6.  Wash thoroughly, paying special attention to the area where your  surgery  will be performed.  7.  Thoroughly rinse your body with warm water from the neck down.  8.  DO NOT shower/wash with your normal soap after using and rinsing off the CHG Soap.            9.  Pat yourself dry with a clean towel.            10.  Wear clean pajamas.            11.  Place clean sheets on your bed the night of your first shower and do not  sleep with pets.  ON THE DAY OF SURGERY : Do not apply any lotions/deodorants the morning of surgery.  Please wear clean clothes to the hospital/surgery center.     FAILURE TO FOLLOW THESE INSTRUCTIONS MAY RESULT IN THE CANCELLATION OF YOUR SURGERY  PATIENT SIGNATURE_________________________________  NURSE SIGNATURE__________________________________  ________________________________________________________________________

## 2023-08-12 NOTE — Patient Instructions (Signed)
 Preparing for your Surgery  Plan for surgery on Sep 03, 2023 with Dr. Derrel Flies at [facility]. You will be scheduled for robotic assisted total laparoscopic hysterectomy (removal of the uterus and cervix), bilateral salpingo-oophorectomy (removal of both ovaries and fallopian tubes), sentinel lymph node biopsy, possible lymph node dissection, possible laparotomy (larger incision on your abdomen if needed).   Pre-operative Testing -You will receive a phone call from presurgical testing at [facility] to arrange for a pre-operative appointment and lab work.  -Bring your insurance card, copy of an advanced directive if applicable, medication list  -At that visit, you will be asked to sign a consent for a possible blood transfusion in case a transfusion becomes necessary during surgery.  The need for a blood transfusion is rare but having consent is a necessary part of your care.     -You should not be taking blood thinners or aspirin  at least ten days prior to surgery unless instructed by your surgeon.  -Do not take supplements such as fish oil (omega 3), red yeast rice, turmeric before your surgery. STOP TAKING AT LEAST 10 DAYS BEFORE SURGERY. You want to avoid medications with aspirin  in them including headache powders such as BC or Goody's), Excedrin migraine.  Day Before Surgery at Home -You will be asked to take in a light diet the day before surgery. You will be advised you can have clear liquids up until 3 hours before your surgery.    Eat a light diet the day before surgery.  Examples including soups, broths, toast, yogurt, mashed potatoes.  AVOID GAS PRODUCING FOODS AND BEVERAGES. Things to avoid include carbonated beverages (fizzy beverages, sodas), raw fruits and raw vegetables (uncooked), or beans.   If your bowels are filled with gas, your surgeon will have difficulty visualizing your pelvic organs which increases your surgical risks.  Your role in  recovery Your role is to become active as soon as directed by your doctor, while still giving yourself time to heal.  Rest when you feel tired. You will be asked to do the following in order to speed your recovery:  - Cough and breathe deeply. This helps to clear and expand your lungs and can prevent pneumonia after surgery.  - STAY ACTIVE WHEN YOU GET HOME. Do mild physical activity. Walking or moving your legs help your circulation and body functions return to normal. Do not try to get up or walk alone the first time after surgery.   -If you develop swelling on one leg or the other, pain in the back of your leg, redness/warmth in one of your legs, please call the office or go to the Emergency Room to have a doppler to rule out a blood clot. For shortness of breath, chest pain-seek care in the Emergency Room as soon as possible. - Actively manage your pain. Managing your pain lets you move in comfort. We will ask you to rate your pain on a scale of zero to 10. It is your responsibility to tell your doctor or nurse where and how much you hurt so your pain can be treated.  Special Considerations -If you are diabetic, you may be placed on insulin after surgery to have closer control over your blood sugars to promote healing and recovery.  This does not mean that you will be discharged on insulin.  If applicable, your oral antidiabetics will be resumed when you are tolerating a solid diet.  -Your final pathology results from surgery should be  available around one week after surgery and the results will be relayed to you when available.  -Dr. Beatris Bough is the surgeon that assists your GYN Oncologist with surgery.  If you end up staying the night, the next day after your surgery you will either see Dr. Orvil Bland, Dr. Daisey Dryer, or Dr. Abdul Hodgkin.  -FMLA forms can be faxed to [phone] and please allow 5-7 business days for completion.  Pain Management After Surgery -You will be prescribed  your pain medication and bowel regimen medications before surgery so that you can have these available when you are discharged from the hospital. The pain medication is for use ONLY AFTER surgery and a new prescription will not be given.   -Make sure that you have Tylenol  and Ibuprofen IF YOU ARE ABLE TO TAKE THESE MEDICATIONS at home to use on a regular basis after surgery for pain control. We recommend alternating the medications every hour to six hours since they work differently and are processed in the body differently for pain relief.  -Review the attached handout on narcotic use and their risks and side effects.   Bowel Regimen -You will be prescribed Sennakot-S to take nightly to prevent constipation especially if you are taking the narcotic pain medication intermittently.  It is important to prevent constipation and drink adequate amounts of liquids. You can stop taking this medication when you are not taking pain medication and you are back on your normal bowel routine.  Risks of Surgery Risks of surgery are low but include bleeding, infection, damage to surrounding structures, re-operation, blood clots, and very rarely death.   Blood Transfusion Information (For the consent to be signed before surgery)  We will be checking your blood type before surgery so in case of emergencies, we will know what type of blood you would need.                                            WHAT IS A BLOOD TRANSFUSION?  A transfusion is the replacement of blood or some of its parts. Blood is made up of multiple cells which provide different functions. Red blood cells carry oxygen and are used for blood loss replacement. White blood cells fight against infection. Platelets control bleeding. Plasma helps clot blood. Other blood products are available for specialized needs, such as hemophilia or other clotting disorders. BEFORE THE TRANSFUSION  Who gives blood for transfusions?  You may be able to  donate blood to be used at a later date on yourself (autologous donation). Relatives can be asked to donate blood. This is generally not any safer than if you have received blood from a stranger. The same precautions are taken to ensure safety when a relative's blood is donated. Healthy volunteers who are fully evaluated to make sure their blood is safe. This is blood bank blood. Transfusion therapy is the safest it has ever been in the practice of medicine. Before blood is taken from a donor, a complete history is taken to make sure that person has no history of diseases nor engages in risky social behavior (examples are intravenous drug use or sexual activity with multiple partners). The donor's travel history is screened to minimize risk of transmitting infections, such as malaria. The donated blood is tested for signs of infectious diseases, such as HIV and hepatitis. The blood is then tested to be sure it  is compatible with you in order to minimize the chance of a transfusion reaction. If you or a relative donates blood, this is often done in anticipation of surgery and is not appropriate for emergency situations. It takes many days to process the donated blood. RISKS AND COMPLICATIONS Although transfusion therapy is very safe and saves many lives, the main dangers of transfusion include:  Getting an infectious disease. Developing a transfusion reaction. This is an allergic reaction to something in the blood you were given. Every precaution is taken to prevent this. The decision to have a blood transfusion has been considered carefully by your caregiver before blood is given. Blood is not given unless the benefits outweigh the risks.  AFTER SURGERY INSTRUCTIONS  Return to work: 4-6 weeks if applicable  Activity: 1. Be up and out of the bed during the day.  Take a nap if needed.  You may walk up steps but be careful and use the hand rail.  Stair climbing will tire you more than you think, you may  need to stop part way and rest.   2. No lifting or straining for 6 weeks over 10 pounds. No pushing, pulling, straining for 6 weeks.  3. No driving for 9-60 days when the following criteria have been met: Do not drive if you are taking narcotic pain medicine and make sure that your reaction time has returned.   4. You can shower as soon as the next day after surgery. Shower daily.  Use your regular soap and water (not directly on the incision) and pat your incision(s) dry afterwards; don't rub.  No tub baths or submerging your body in water until cleared by your surgeon. If you have the soap that was given to you by pre-surgical testing that was used before surgery, you do not need to use it afterwards because this can irritate your incisions.   5. No sexual activity and nothing in the vagina for 12 weeks.  6. You may experience a small amount of clear drainage from your incisions, which is normal.  If the drainage persists, increases, or changes color please call the office.  7. Do not use creams, lotions, or ointments such as neosporin on your incisions after surgery until advised by your surgeon because they can cause removal of the dermabond glue on your incisions.    8. You may experience vaginal spotting after surgery or when the stitches at the top of the vagina begin to dissolve.  The spotting is normal but if you experience heavy bleeding, call our office.  9. Take Tylenol  or ibuprofen first for pain if you are able to take these medications and only use narcotic pain medication for severe pain not relieved by the Tylenol  or Ibuprofen.  Monitor your Tylenol  intake to a max of 4,000 mg in a 24 hour period. You can alternate these medications after surgery.  Diet: 1. Low sodium Heart Healthy Diet is recommended but you are cleared to resume your normal (before surgery) diet after your procedure.  2. It is safe to use a laxative, such as Miralax or Colace, if you have difficulty moving  your bowels before surgery. You have been prescribed Sennakot-S to take at bedtime every evening after surgery to keep bowel movements regular and to prevent constipation.    Wound Care: 1. Keep clean and dry.  Shower daily.  Reasons to call the Doctor: Fever - Oral temperature greater than 100.4 degrees Fahrenheit Foul-smelling vaginal discharge Difficulty urinating Nausea and vomiting  Increased pain at the site of the incision that is unrelieved with pain medicine. Difficulty breathing with or without chest pain New calf pain especially if only on one side Sudden, continuing increased vaginal bleeding with or without clots.   Contacts: For questions or concerns you should contact:  Dr. Derrel Flies at [phone]  Vira Grieves, NP at [phone]  After Hours: call [phone] and have the GYN Oncologist paged/contacted (after 5 pm or on the weekends). You will speak with an after hours RN and let he or she know you have had surgery.  Messages sent via mychart are for non-urgent matters and are not responded to after hours so for urgent needs, please call the after hours number.

## 2023-08-13 NOTE — Discharge Instructions (Signed)
 AFTER SURGERY INSTRUCTIONS   Return to work: 4-6 weeks if applicable  You have some dissolvable stitches placed at the entrance of the vagina from small tears that occurred during surgery when removing the uterus. You can rinse this area after toileting with a water or spray bottle and pat dry. For soreness, you can apply ice if needed.  To use as an ice pack to the vulva as needed for soreness. We recommend purchasing several bags of frozen green peas and dividing them into ziploc bags. You will want to keep these in the freezer and have them ready to use as ice packs to the vulvar incision. Once the ice pack is no longer cold, you can get another from the freezer. The frozen peas mold to your body better than a regular ice pack.    Activity: 1. Be up and out of the bed during the day.  Take a nap if needed.  You may walk up steps but be careful and use the hand rail.  Stair climbing will tire you more than you think, you may need to stop part way and rest.    2. No lifting or straining for 6 weeks over 10 pounds. No pushing, pulling, straining for 6 weeks.   3. No driving for 1-61 days when the following criteria have been met: Do not drive if you are taking narcotic pain medicine and make sure that your reaction time has returned.    4. You can shower as soon as the next day after surgery. Shower daily.  Use your regular soap and water (not directly on the incision) and pat your incision(s) dry afterwards; don't rub.  No tub baths or submerging your body in water until cleared by your surgeon. If you have the soap that was given to you by pre-surgical testing that was used before surgery, you do not need to use it afterwards because this can irritate your incisions.    5. No sexual activity and nothing in the vagina for 12 weeks.   6. You may experience a small amount of clear drainage from your incisions, which is normal.  If the drainage persists, increases, or changes color please call the  office.   7. Do not use creams, lotions, or ointments such as neosporin on your incisions after surgery until advised by your surgeon because they can cause removal of the dermabond glue on your incisions.     8. You may experience vaginal spotting after surgery or when the stitches at the top of the vagina begin to dissolve.  The spotting is normal but if you experience heavy bleeding, call our office.   9. Take Tylenol  or ibuprofen first for pain if you are able to take these medications and only use narcotic pain medication for severe pain not relieved by the Tylenol  or Ibuprofen.  Monitor your Tylenol  intake to a max of 4,000 mg in a 24 hour period. You can alternate these medications after surgery.   Diet: 1. Low sodium Heart Healthy Diet is recommended but you are cleared to resume your normal (before surgery) diet after your procedure.   2. It is safe to use a laxative, such as Miralax or Colace, if you have difficulty moving your bowels before surgery. You have been prescribed Sennakot-S to take at bedtime every evening after surgery to keep bowel movements regular and to prevent constipation.     Wound Care: 1. Keep clean and dry.  Shower daily.   Reasons to call  the Doctor: Fever - Oral temperature greater than 100.4 degrees Fahrenheit Foul-smelling vaginal discharge Difficulty urinating Nausea and vomiting Increased pain at the site of the incision that is unrelieved with pain medicine. Difficulty breathing with or without chest pain New calf pain especially if only on one side Sudden, continuing increased vaginal bleeding with or without clots.   Contacts: For questions or concerns you should contact:   Dr. Wiley Hanger at [phone]   Vira Grieves, NP at [phone]   After Hours: call [phone] and have the GYN Oncologist paged/contacted (after 5 pm or on the weekends). You will speak with an after hours RN and let he or she know you have had surgery.    Messages sent via mychart are for non-urgent matters and are not responded to after hours so for urgent needs, please call the after hours number.

## 2023-08-14 ENCOUNTER — Telehealth: Payer: Self-pay

## 2023-08-14 ENCOUNTER — Encounter (HOSPITAL_COMMUNITY)
Admission: RE | Admit: 2023-08-14 | Discharge: 2023-08-14 | Disposition: A | Discharge status: Home / Self Care | Source: Ambulatory Visit | Attending: Gynecologic Oncology | Admitting: Gynecologic Oncology

## 2023-08-14 ENCOUNTER — Other Ambulatory Visit: Payer: Self-pay

## 2023-08-14 ENCOUNTER — Telehealth: Payer: Self-pay | Admitting: *Deleted

## 2023-08-14 ENCOUNTER — Encounter (HOSPITAL_COMMUNITY): Payer: Self-pay

## 2023-08-14 VITALS — BP 136/82 | HR 68 | Temp 98.6°F | Resp 14 | Ht 65.0 in | Wt 185.0 lb

## 2023-08-14 DIAGNOSIS — Z01810 Encounter for preprocedural cardiovascular examination: Secondary | ICD-10-CM | POA: Diagnosis present

## 2023-08-14 DIAGNOSIS — Z01818 Encounter for other preprocedural examination: Secondary | ICD-10-CM | POA: Insufficient documentation

## 2023-08-14 DIAGNOSIS — Z01812 Encounter for preprocedural laboratory examination: Secondary | ICD-10-CM | POA: Diagnosis present

## 2023-08-14 DIAGNOSIS — R7303 Prediabetes: Secondary | ICD-10-CM | POA: Insufficient documentation

## 2023-08-14 DIAGNOSIS — C541 Malignant neoplasm of endometrium: Secondary | ICD-10-CM | POA: Insufficient documentation

## 2023-08-14 HISTORY — DX: Headache, unspecified: R51.9

## 2023-08-14 LAB — CBC
HCT: 42.6 % (ref 36.0–46.0)
Hemoglobin: 13.5 g/dL (ref 12.0–15.0)
MCH: 30.1 pg (ref 26.0–34.0)
MCHC: 31.7 g/dL (ref 30.0–36.0)
MCV: 95.1 fL (ref 80.0–100.0)
Platelets: 264 10*3/uL (ref 150–400)
RBC: 4.48 MIL/uL (ref 3.87–5.11)
RDW: 13.3 % (ref 11.5–15.5)
WBC: 5.6 10*3/uL (ref 4.0–10.5)
nRBC: 0 % (ref 0.0–0.2)

## 2023-08-14 LAB — COMPREHENSIVE METABOLIC PANEL WITH GFR
ALT: 24 U/L (ref 0–44)
AST: 19 U/L (ref 15–41)
Albumin: 3.9 g/dL (ref 3.5–5.0)
Alkaline Phosphatase: 76 U/L (ref 38–126)
Anion gap: 7 (ref 5–15)
BUN: 16 mg/dL (ref 8–23)
CO2: 22 mmol/L (ref 22–32)
Calcium: 8.7 mg/dL — ABNORMAL LOW (ref 8.9–10.3)
Chloride: 104 mmol/L (ref 98–111)
Creatinine, Ser: 0.82 mg/dL (ref 0.44–1.00)
GFR, Estimated: >60 mL/min (ref >60)
Glucose, Bld: 102 mg/dL — ABNORMAL HIGH (ref 70–99)
Potassium: 4.1 mmol/L (ref 3.5–5.1)
Sodium: 133 mmol/L — ABNORMAL LOW (ref 135–145)
Total Bilirubin: 0.6 mg/dL (ref 0.0–1.2)
Total Protein: 7.1 g/dL (ref 6.5–8.1)

## 2023-08-14 NOTE — Telephone Encounter (Signed)
-----   Message from Suellyn Emory sent at 08/14/2023 11:23 AM EDT ----- Preop labs. Na+ slightly low. Would call her and make sure she is not strictly restricting her sodium. She can eat some foods with salt ----- Message ----- From: Interface, Lab In [place] Sent: 08/14/2023   9:44 AM EDT To: Suellyn Emory, NP

## 2023-08-14 NOTE — Telephone Encounter (Signed)
 Pt is aware of recent pre-admit labs (sodium slightly low) she states she doesn't restrict her sodium, but doesn't add salt. She will be more mindful of foods with salt and increase slightly.

## 2023-08-14 NOTE — Telephone Encounter (Signed)
Telephone call to check on pre-operative status.  Patient compliant with pre-operative instructions.  Reinforced nothing to eat after midnight. Clear liquids until 0745. Patient to arrive at 0845.  No questions or concerns voiced.  Instructed to call for any needs.  °

## 2023-08-15 ENCOUNTER — Encounter (HOSPITAL_COMMUNITY)
Admission: RE | Disposition: A | Discharge status: Home / Self Care | Payer: Self-pay | Source: Home / Self Care | Attending: Gynecologic Oncology

## 2023-08-15 ENCOUNTER — Encounter (HOSPITAL_COMMUNITY): Payer: Self-pay | Admitting: Gynecologic Oncology

## 2023-08-15 ENCOUNTER — Ambulatory Visit (HOSPITAL_BASED_OUTPATIENT_CLINIC_OR_DEPARTMENT_OTHER): Admitting: Anesthesiology

## 2023-08-15 ENCOUNTER — Other Ambulatory Visit: Payer: Self-pay

## 2023-08-15 ENCOUNTER — Ambulatory Visit (HOSPITAL_COMMUNITY)
Admission: RE | Admit: 2023-08-15 | Discharge: 2023-08-15 | Disposition: A | Discharge status: Home / Self Care | Attending: Gynecologic Oncology | Admitting: Gynecologic Oncology

## 2023-08-15 ENCOUNTER — Ambulatory Visit (HOSPITAL_COMMUNITY): Admitting: Anesthesiology

## 2023-08-15 DIAGNOSIS — E785 Hyperlipidemia, unspecified: Secondary | ICD-10-CM

## 2023-08-15 DIAGNOSIS — Z6831 Body mass index (BMI) 31.0-31.9, adult: Secondary | ICD-10-CM | POA: Insufficient documentation

## 2023-08-15 DIAGNOSIS — C541 Malignant neoplasm of endometrium: Secondary | ICD-10-CM

## 2023-08-15 DIAGNOSIS — E669 Obesity, unspecified: Secondary | ICD-10-CM | POA: Insufficient documentation

## 2023-08-15 HISTORY — PX: ROBOTIC ASSISTED TOTAL HYSTERECTOMY WITH BILATERAL SALPINGO OOPHERECTOMY: SHX6086

## 2023-08-15 HISTORY — PX: INJECTION, FOR SENTINEL LYMPH NODE IDENTIFICATION: SHX7598

## 2023-08-15 HISTORY — PX: LAPAROSCOPIC TOTAL PELVIC LYMPHADENECTOMY: SHX7605

## 2023-08-15 LAB — ABO/RH: ABO/RH(D): A POS

## 2023-08-15 LAB — TYPE AND SCREEN
ABO/RH(D): A POS
Antibody Screen: NEGATIVE

## 2023-08-15 SURGERY — HYSTERECTOMY, TOTAL, ROBOT-ASSISTED, LAPAROSCOPIC, WITH BILATERAL SALPINGO-OOPHORECTOMY
Anesthesia: General | Site: Pelvis

## 2023-08-15 MED ORDER — ROCURONIUM BROMIDE 10 MG/ML (PF) SYRINGE
PREFILLED_SYRINGE | INTRAVENOUS | Status: AC
  Filled 2023-08-15: qty 10

## 2023-08-15 MED ORDER — FENTANYL CITRATE PF 50 MCG/ML IJ SOSY
PREFILLED_SYRINGE | INTRAMUSCULAR | Status: DC
Start: 2023-08-15 — End: 2023-08-15
  Filled 2023-08-15: qty 1

## 2023-08-15 MED ORDER — FENTANYL CITRATE (PF) 250 MCG/5ML IJ SOLN
INTRAMUSCULAR | Status: AC
  Filled 2023-08-15: qty 5

## 2023-08-15 MED ORDER — HEPARIN SODIUM (PORCINE) 5000 UNIT/ML IJ SOLN
5000.0000 [IU] | INTRAMUSCULAR | Status: AC
  Administered 2023-08-15: 5000 [IU] via SUBCUTANEOUS
  Filled 2023-08-15: qty 1

## 2023-08-15 MED ORDER — HYDROMORPHONE HCL 1 MG/ML IJ SOLN
INTRAMUSCULAR | Status: AC
  Filled 2023-08-15: qty 1

## 2023-08-15 MED ORDER — EPHEDRINE SULFATE-NACL 50-0.9 MG/10ML-% IV SOSY
PREFILLED_SYRINGE | INTRAVENOUS | Status: DC | PRN
  Administered 2023-08-15 (×2): 5 mg via INTRAVENOUS

## 2023-08-15 MED ORDER — FENTANYL CITRATE PF 50 MCG/ML IJ SOSY
25.0000 ug | PREFILLED_SYRINGE | INTRAMUSCULAR | Status: DC | PRN
  Administered 2023-08-15: 50 ug via INTRAVENOUS
  Administered 2023-08-15 (×2): 25 ug via INTRAVENOUS
  Administered 2023-08-15: 50 ug via INTRAVENOUS

## 2023-08-15 MED ORDER — LACTATED RINGERS IV SOLN: INTRAVENOUS | Status: DC

## 2023-08-15 MED ORDER — LACTATED RINGERS IV SOLN: INTRAVENOUS | Status: DC | PRN

## 2023-08-15 MED ORDER — GENTAMICIN SULFATE 40 MG/ML IJ SOLN
400.0000 mg | INTRAVENOUS | Status: AC
  Administered 2023-08-15: 400 mg via INTRAVENOUS
  Filled 2023-08-15: qty 10

## 2023-08-15 MED ORDER — OXYCODONE HCL 5 MG PO TABS
ORAL_TABLET | ORAL | Status: AC
  Filled 2023-08-15: qty 1

## 2023-08-15 MED ORDER — ACETAMINOPHEN 500 MG PO TABS: 1000.0000 mg | ORAL_TABLET | Freq: Once | ORAL | Status: DC

## 2023-08-15 MED ORDER — ONDANSETRON HCL 4 MG/2ML IJ SOLN
INTRAMUSCULAR | Status: AC
  Filled 2023-08-15: qty 2

## 2023-08-15 MED ORDER — SUGAMMADEX SODIUM 200 MG/2ML IV SOLN
INTRAVENOUS | Status: DC | PRN
  Administered 2023-08-15: 190 mg via INTRAVENOUS

## 2023-08-15 MED ORDER — OXYCODONE HCL 5 MG PO TABS
5.0000 mg | ORAL_TABLET | Freq: Once | ORAL | Status: AC | PRN
  Administered 2023-08-15: 5 mg via ORAL

## 2023-08-15 MED ORDER — DEXAMETHASONE SODIUM PHOSPHATE 10 MG/ML IJ SOLN
INTRAMUSCULAR | Status: DC | PRN
  Administered 2023-08-15: 10 mg via INTRAVENOUS

## 2023-08-15 MED ORDER — MIDAZOLAM HCL 2 MG/2ML IJ SOLN
INTRAMUSCULAR | Status: DC | PRN
  Administered 2023-08-15: 2 mg via INTRAVENOUS

## 2023-08-15 MED ORDER — STERILE WATER FOR IRRIGATION IR SOLN
Status: DC | PRN
  Administered 2023-08-15: 1000 mL

## 2023-08-15 MED ORDER — CHLORHEXIDINE GLUCONATE 0.12 % MT SOLN
15.0000 mL | Freq: Once | OROMUCOSAL | Status: AC
  Administered 2023-08-15: 15 mL via OROMUCOSAL

## 2023-08-15 MED ORDER — PROPOFOL 10 MG/ML IV BOLUS
INTRAVENOUS | Status: AC
  Filled 2023-08-15: qty 20

## 2023-08-15 MED ORDER — STERILE WATER FOR INJECTION IJ SOLN
INTRAMUSCULAR | Status: DC | PRN
  Administered 2023-08-15: 4 mL via INTRAMUSCULAR

## 2023-08-15 MED ORDER — LIDOCAINE HCL (PF) 2 % IJ SOLN
INTRAMUSCULAR | Status: DC | PRN
  Administered 2023-08-15: 1.5 mg/kg/h

## 2023-08-15 MED ORDER — KETAMINE HCL 50 MG/5ML IJ SOSY
PREFILLED_SYRINGE | INTRAMUSCULAR | Status: AC
  Filled 2023-08-15: qty 5

## 2023-08-15 MED ORDER — LIDOCAINE HCL (PF) 2 % IJ SOLN
INTRAMUSCULAR | Status: AC
  Filled 2023-08-15: qty 5

## 2023-08-15 MED ORDER — ACETAMINOPHEN 500 MG PO TABS
1000.0000 mg | ORAL_TABLET | ORAL | Status: AC
  Administered 2023-08-15: 1000 mg via ORAL
  Filled 2023-08-15: qty 2

## 2023-08-15 MED ORDER — KETAMINE HCL 50 MG/5ML IJ SOSY
PREFILLED_SYRINGE | INTRAMUSCULAR | Status: DC | PRN
  Administered 2023-08-15: 30 mg via INTRAVENOUS

## 2023-08-15 MED ORDER — MIDAZOLAM HCL 2 MG/2ML IJ SOLN
INTRAMUSCULAR | Status: AC
  Filled 2023-08-15: qty 2

## 2023-08-15 MED ORDER — LIDOCAINE HCL (CARDIAC) PF 100 MG/5ML IV SOSY
PREFILLED_SYRINGE | INTRAVENOUS | Status: DC | PRN
  Administered 2023-08-15: 80 mg via INTRAVENOUS

## 2023-08-15 MED ORDER — EPHEDRINE 5 MG/ML INJ
INTRAVENOUS | Status: AC
  Filled 2023-08-15: qty 5

## 2023-08-15 MED ORDER — BUPIVACAINE HCL 0.25 % IJ SOLN
INTRAMUSCULAR | Status: DC | PRN
  Administered 2023-08-15: 20 mL

## 2023-08-15 MED ORDER — DEXAMETHASONE SODIUM PHOSPHATE 10 MG/ML IJ SOLN: 4.0000 mg | INTRAMUSCULAR | Status: DC

## 2023-08-15 MED ORDER — PROPOFOL 500 MG/50ML IV EMUL
INTRAVENOUS | Status: DC | PRN
Start: 2023-08-15 — End: 2023-08-15
  Administered 2023-08-15: 150 ug/kg/min via INTRAVENOUS

## 2023-08-15 MED ORDER — STERILE WATER FOR INJECTION IJ SOLN
INTRAMUSCULAR | Status: DC | PRN
  Administered 2023-08-15: 4 mL via SURGICAL_CAVITY

## 2023-08-15 MED ORDER — LIDOCAINE HCL 1 % IJ SOLN
INTRAMUSCULAR | Status: AC
  Filled 2023-08-15: qty 20

## 2023-08-15 MED ORDER — OXYCODONE HCL 5 MG/5ML PO SOLN: 5.0000 mg | Freq: Once | ORAL | Status: AC | PRN

## 2023-08-15 MED ORDER — AMISULPRIDE (ANTIEMETIC) 5 MG/2ML IV SOLN: 10.0000 mg | Freq: Once | INTRAVENOUS | Status: DC | PRN

## 2023-08-15 MED ORDER — LACTATED RINGERS IR SOLN
Status: DC | PRN
  Administered 2023-08-15: 1000 mL

## 2023-08-15 MED ORDER — ORAL CARE MOUTH RINSE: 15.0000 mL | Freq: Once | OROMUCOSAL | Status: AC

## 2023-08-15 MED ORDER — CLINDAMYCIN PHOSPHATE 900 MG/50ML IV SOLN
900.0000 mg | INTRAVENOUS | Status: AC
  Administered 2023-08-15: 900 mg via INTRAVENOUS
  Filled 2023-08-15: qty 50

## 2023-08-15 MED ORDER — ROCURONIUM BROMIDE 10 MG/ML (PF) SYRINGE
PREFILLED_SYRINGE | INTRAVENOUS | Status: DC | PRN
  Administered 2023-08-15: 50 mg via INTRAVENOUS
  Administered 2023-08-15: 20 mg via INTRAVENOUS

## 2023-08-15 MED ORDER — PROPOFOL 1000 MG/100ML IV EMUL
INTRAVENOUS | Status: AC
  Filled 2023-08-15: qty 100

## 2023-08-15 MED ORDER — FENTANYL CITRATE PF 50 MCG/ML IJ SOSY
PREFILLED_SYRINGE | INTRAMUSCULAR | Status: AC
  Filled 2023-08-15: qty 2

## 2023-08-15 MED ORDER — PROPOFOL 10 MG/ML IV BOLUS
INTRAVENOUS | Status: DC | PRN
  Administered 2023-08-15: 150 mg via INTRAVENOUS

## 2023-08-15 MED ORDER — DEXAMETHASONE SODIUM PHOSPHATE 10 MG/ML IJ SOLN
INTRAMUSCULAR | Status: AC
  Filled 2023-08-15: qty 1

## 2023-08-15 MED ORDER — BUPIVACAINE HCL (PF) 0.25 % IJ SOLN
INTRAMUSCULAR | Status: AC
Start: 2023-08-15 — End: ?
  Filled 2023-08-15: qty 30

## 2023-08-15 MED ORDER — HYDROMORPHONE HCL 1 MG/ML IJ SOLN
0.2500 mg | INTRAMUSCULAR | Status: DC | PRN
  Administered 2023-08-15 (×2): 0.5 mg via INTRAVENOUS

## 2023-08-15 MED ORDER — FENTANYL CITRATE (PF) 250 MCG/5ML IJ SOLN
INTRAMUSCULAR | Status: DC | PRN
  Administered 2023-08-15 (×2): 50 ug via INTRAVENOUS
  Administered 2023-08-15: 100 ug via INTRAVENOUS

## 2023-08-15 MED ORDER — ONDANSETRON HCL 4 MG/2ML IJ SOLN
INTRAMUSCULAR | Status: DC | PRN
  Administered 2023-08-15: 4 mg via INTRAVENOUS

## 2023-08-15 SURGICAL SUPPLY — 65 items
APPLICATOR SURGIFLO ENDO (HEMOSTASIS) IMPLANT
BAG COUNTER SPONGE SURGICOUNT (BAG) IMPLANT
BAG LAPAROSCOPIC 12 15 PORT 16 (BASKET) IMPLANT
BLADE SURG SZ10 CARB STEEL (BLADE) IMPLANT
COVER BACK TABLE 60X90IN (DRAPES) ×2 IMPLANT
COVER TIP SHEARS 8 DVNC (MISCELLANEOUS) ×2 IMPLANT
DERMABOND ADVANCED .7 DNX12 (GAUZE/BANDAGES/DRESSINGS) ×2 IMPLANT
DRAPE ARM DVNC X/XI (DISPOSABLE) ×8 IMPLANT
DRAPE COLUMN DVNC XI (DISPOSABLE) ×2 IMPLANT
DRAPE SHEET LG 3/4 BI-LAMINATE (DRAPES) ×2 IMPLANT
DRAPE SURG IRRIG POUCH 19X23 (DRAPES) ×2 IMPLANT
DRIVER NDL MEGA SUTCUT DVNCXI (INSTRUMENTS) ×2 IMPLANT
DRIVER NDLE MEGA SUTCUT DVNCXI (INSTRUMENTS) ×2 IMPLANT
DRSG OPSITE POSTOP 4X6 (GAUZE/BANDAGES/DRESSINGS) IMPLANT
DRSG OPSITE POSTOP 4X8 (GAUZE/BANDAGES/DRESSINGS) IMPLANT
ELECT PENCIL ROCKER SW 15FT (MISCELLANEOUS) IMPLANT
ELECT REM PT RETURN 15FT ADLT (MISCELLANEOUS) ×2 IMPLANT
FORCEPS BPLR FENES DVNC XI (FORCEP) ×2 IMPLANT
FORCEPS PROGRASP DVNC XI (FORCEP) ×2 IMPLANT
GAUZE 4X4 16PLY ~~LOC~~+RFID DBL (SPONGE) ×4 IMPLANT
GLOVE BIO SURGEON STRL SZ 6 (GLOVE) ×8 IMPLANT
GLOVE BIO SURGEON STRL SZ 6.5 (GLOVE) ×2 IMPLANT
GOWN STRL REUS W/ TWL LRG LVL3 (GOWN DISPOSABLE) ×8 IMPLANT
GRASPER SUT TROCAR 14GX15 (MISCELLANEOUS) IMPLANT
HOLDER FOLEY CATH W/STRAP (MISCELLANEOUS) IMPLANT
IRRIGATION SUCT STRKRFLW 2 WTP (MISCELLANEOUS) ×2 IMPLANT
KIT PROCEDURE DVNC SI (MISCELLANEOUS) IMPLANT
KIT TURNOVER KIT A (KITS) IMPLANT
LIGASURE IMPACT 36 18CM CVD LR (INSTRUMENTS) IMPLANT
MANIPULATOR ADVINCU DEL 3.0 PL (MISCELLANEOUS) IMPLANT
MANIPULATOR ADVINCU DEL 3.5 PL (MISCELLANEOUS) IMPLANT
MANIPULATOR UTERINE 4.5 ZUMI (MISCELLANEOUS) IMPLANT
NDL HYPO 21X1.5 SAFETY (NEEDLE) ×2 IMPLANT
NDL SPNL 18GX3.5 QUINCKE PK (NEEDLE) IMPLANT
NEEDLE HYPO 21X1.5 SAFETY (NEEDLE) ×2 IMPLANT
NEEDLE SPNL 18GX3.5 QUINCKE PK (NEEDLE) IMPLANT
OBTURATOR OPTICALSTD 8 DVNC (TROCAR) ×2 IMPLANT
PACK ROBOT GYN CUSTOM WL (TRAY / TRAY PROCEDURE) ×2 IMPLANT
PAD POSITIONING PINK XL (MISCELLANEOUS) ×2 IMPLANT
PORT ACCESS TROCAR AIRSEAL 12 (TROCAR) IMPLANT
SCISSORS MNPLR CVD DVNC XI (INSTRUMENTS) ×2 IMPLANT
SCRUB CHG 4% DYNA-HEX 4OZ (MISCELLANEOUS) IMPLANT
SEAL UNIV 5-12 XI (MISCELLANEOUS) ×8 IMPLANT
SET TRI-LUMEN FLTR TB AIRSEAL (TUBING) ×2 IMPLANT
SPIKE FLUID TRANSFER (MISCELLANEOUS) ×2 IMPLANT
SPONGE T-LAP 18X18 ~~LOC~~+RFID (SPONGE) IMPLANT
SURGIFLO W/THROMBIN 8M KIT (HEMOSTASIS) IMPLANT
SUT MNCRL AB 4-0 PS2 18 (SUTURE) IMPLANT
SUT PDS AB 1 TP1 96 (SUTURE) IMPLANT
SUT STRATA PDS 0 30 CT-2.5 (SUTURE) IMPLANT
SUT VIC AB 0 CT1 27XBRD ANTBC (SUTURE) IMPLANT
SUT VIC AB 2-0 CT1 TAPERPNT 27 (SUTURE) IMPLANT
SUT VIC AB 2-0 SH 27X BRD (SUTURE) IMPLANT
SUT VIC AB 4-0 PS2 18 (SUTURE) ×4 IMPLANT
SUT VICRYL 0 27 CT2 27 ABS (SUTURE) ×2 IMPLANT
SUT VLOC 180 0 9IN GS21 (SUTURE) IMPLANT
SYR 10ML LL (SYRINGE) IMPLANT
SYSTEM BAG RETRIEVAL 10MM (BASKET) IMPLANT
SYSTEM WOUND ALEXIS 18CM MED (MISCELLANEOUS) IMPLANT
TRAP SPECIMEN MUCUS 40CC (MISCELLANEOUS) IMPLANT
TRAY FOLEY MTR SLVR 16FR STAT (SET/KITS/TRAYS/PACK) ×2 IMPLANT
TROCAR PORT AIRSEAL 5X120 (TROCAR) IMPLANT
UNDERPAD 30X36 HEAVY ABSORB (UNDERPADS AND DIAPERS) ×4 IMPLANT
WATER STERILE IRR 1000ML POUR (IV SOLUTION) ×2 IMPLANT
YANKAUER SUCT BULB TIP 10FT TU (MISCELLANEOUS) IMPLANT

## 2023-08-15 NOTE — Anesthesia Procedure Notes (Signed)
 Procedure Name: Intubation Date/Time: 08/15/2023 11:09 AM  Performed by: Vella Gey, CRNAPre-anesthesia Checklist: Patient identified, Emergency Drugs available, Suction available and Patient being monitored Patient Re-evaluated:Patient Re-evaluated prior to induction Oxygen Delivery Method: Circle system utilized Preoxygenation: Pre-oxygenation with 100% oxygen Induction Type: IV induction Ventilation: Mask ventilation without difficulty Laryngoscope Size: Glidescope and 3 Grade View: Grade I Tube type: Parker flex tip Tube size: 7.0 mm Number of attempts: 1 Airway Equipment and Method: Rigid stylet and Video-laryngoscopy Placement Confirmation: ETT inserted through vocal cords under direct vision, positive ETCO2 and breath sounds checked- equal and bilateral Secured at: 22 cm Tube secured with: Tape Dental Injury: Teeth and Oropharynx as per pre-operative assessment

## 2023-08-15 NOTE — Transfer of Care (Signed)
 Immediate Anesthesia Transfer of Care Note  Patient: Haley Luna  Procedure(s) Performed: HYSTERECTOMY, TOTAL, ROBOT-ASSISTED, LAPAROSCOPIC, WITH BILATERAL SALPINGO-OOPHORECTOMY (Bilateral: Pelvis) INJECTION, FOR SENTINEL LYMPH NODE IDENTIFICATION (Pelvis) LAPAROSCOPIC TOTAL PELVIC LYMPHADENECTOMY (Pelvis)  Patient Location: PACU  Anesthesia Type:General  Level of Consciousness: drowsy  Airway & Oxygen Therapy: Patient Spontanous Breathing and Patient connected to face mask oxygen  Post-op Assessment: Report given to RN and Post -op Vital signs reviewed and stable  Post vital signs: Reviewed and stable  Last Vitals:  Vitals Value Taken Time  BP    Temp    Pulse 64 08/15/23 1318  Resp 16 08/15/23 1318  SpO2 99 % 08/15/23 1318  Vitals shown include unfiled device data.  Last Pain:  Vitals:   08/15/23 0939  TempSrc:   PainSc: 0-No pain         Complications: No notable events documented.

## 2023-08-15 NOTE — Interval H&P Note (Signed)
 History and Physical Interval Note:  08/15/2023 10:07 AM  Haley Luna  has presented today for surgery, with the diagnosis of ENDOMETRIAL CANCER.  The various methods of treatment have been discussed with the patient and family. After consideration of risks, benefits and other options for treatment, the patient has consented to  Procedure(s): HYSTERECTOMY, TOTAL, ROBOT-ASSISTED, LAPAROSCOPIC, WITH BILATERAL SALPINGO-OOPHORECTOMY (Bilateral) INJECTION, FOR SENTINEL LYMPH NODE IDENTIFICATION (N/A) LAPAROSCOPIC TOTAL PELVIC LYMPHADENECTOMY (N/A) as a surgical intervention.  The patient's history has been reviewed, patient examined, no change in status, stable for surgery.  I have reviewed the patient's chart and labs.  Questions were answered to the patient's satisfaction.     Suzi Essex

## 2023-08-15 NOTE — Anesthesia Preprocedure Evaluation (Addendum)
 Anesthesia Evaluation  Patient identified by MRN, date of birth, ID band Patient awake    Reviewed: Allergy & Precautions, NPO status , Patient's Chart, lab work & pertinent test results  Airway Mallampati: II  TM Distance: >3 FB Neck ROM: Full    Dental no notable dental hx. (+) Teeth Intact, Dental Advisory Given   Pulmonary neg pulmonary ROS   Pulmonary exam normal breath sounds clear to auscultation       Cardiovascular negative cardio ROS Normal cardiovascular exam Rhythm:Regular Rate:Normal     Neuro/Psych  Headaches  negative psych ROS   GI/Hepatic negative GI ROS, Neg liver ROS,,,  Endo/Other  negative endocrine ROS  Obese BMI 31, prediabetes  Renal/GU negative Renal ROS  negative genitourinary   Musculoskeletal negative musculoskeletal ROS (+)    Abdominal   Peds  Hematology negative hematology ROS (+)   Anesthesia Other Findings   Reproductive/Obstetrics                             Anesthesia Physical Anesthesia Plan  ASA: 2  Anesthesia Plan: General   Post-op Pain Management: Tylenol  PO (pre-op)* and Ketamine IV*   Induction: Intravenous  PONV Risk Score and Plan: 3 and Midazolam , Dexamethasone , Ondansetron  and TIVA  Airway Management Planned: Oral ETT  Additional Equipment:   Intra-op Plan:   Post-operative Plan: Extubation in OR  Informed Consent: I have reviewed the patients History and Physical, chart, labs and discussed the procedure including the risks, benefits and alternatives for the proposed anesthesia with the patient or authorized representative who has indicated his/her understanding and acceptance.     Dental advisory given  Plan Discussed with: CRNA  Anesthesia Plan Comments: (2 IVs)       Anesthesia Quick Evaluation

## 2023-08-15 NOTE — Op Note (Signed)
 OPERATIVE NOTE  Pre-operative Diagnosis: endometrial cancer grade 1  Post-operative Diagnosis: same  Operation: Robotic-assisted laparoscopic total hysterectomy with bilateral salpingoophorectomy, SLN biopsy bilaterally, repair of vaginal laceration  Surgeon: Wiley Hanger MD  Assistant Surgeon: Vira Grieves, NP (an NP assistant was necessary for tissue manipulation, management of robotic instrumentation, retraction and positioning due to the complexity of the case and hospital policies).   Anesthesia: GET  Urine Output: 75 cc  Operative Findings: On EUA, small mobile uterus. On intra-abdominal entry, normal upper abdominal survey. Normal omentum, small and large bowel, appendix. No ascites. Uterus 8 cm and mildly bulbous at fundus. Atrophic appearing adnexa. Some scarring along posterior fundal uterus and above rectovaginal septum at the level of the cervix. Mild tethering of the left uterosacral ligament at the level of the cervix. Mapping successful to right obturator and left external iliac SLNs, no gross adenopathy.  Estimated Blood Loss:  50 cc      Total IV Fluids: see I&O flowsheet         Specimens: uterus, cervix, bilateral tubes and ovaries, right obturator SLN, left external iliac SLN         Complications:  None apparent; patient tolerated the procedure well.         Disposition: PACU - hemodynamically stable.  Procedure Details  The patient was seen in the Holding Room. The risks, benefits, complications, treatment options, and expected outcomes were discussed with the patient.  The patient concurred with the proposed plan, giving informed consent.  The site of surgery properly noted/marked. The patient was identified as Haley Luna and the procedure verified as a Robotic-assisted hysterectomy with bilateral salpingo oophorectomy with SLN biopsy.   After induction of anesthesia, the patient was draped and prepped in the usual sterile manner. Patient was  placed in supine position after anesthesia and draped and prepped in the usual sterile manner as follows: Her arms were tucked to her side with all appropriate precautions.  The patient was secured to the bed using padding and tape across her chest.  The patient was placed in the semi-lithotomy position in [place] stirrups.  The perineum and vagina were prepped with CHG. The patient's abdomen was prepped with ChloraPrep and she was draped after the prep had been allowed to dry for 3 minutes.  A Time Out was held and the above information confirmed.  The urethra was prepped with Betadine . Foley catheter was placed.  A sterile speculum was placed in the vagina.  The cervix was grasped with a single-tooth tenaculum. 2mg  total of ICG was injected into the cervical stroma at 2 and 9 o'clock with 1cc injected at a 1cm and 2mm depth (concentration 0.5mg /ml) in all locations. The cervix was dilated with Adriana Hopping dilators.  The ZUMI uterine manipulator with a medium colpotomizer ring was placed without difficulty.  A pneum occluder balloon was placed over the manipulator.  OG tube placement was confirmed and to suction.   Next, a 10 mm skin incision was made 1 cm below the subcostal margin in the midclavicular line.  The 5 mm Optiview port and scope was used for direct entry.  Opening pressure was under 10 mm CO2.  The abdomen was insufflated and the findings were noted as above.   At this point and all points during the procedure, the patient's intra-abdominal pressure did not exceed 15 mmHg. Next, an 8 mm skin incision was made superior to the umbilicus and a right and left port were placed about 8 cm lateral  to the robot port on the right and left side.  A fourth arm was placed on the right.  The 5 mm assist trocar was exchanged for a 12 airseal mm port. All ports were placed under direct visualization.  The patient was placed in steep Trendelenburg.  Bowel was folded away into the upper abdomen.  The robot was docked in  the normal manner.  The right and left peritoneum were opened parallel to the IP ligament to open the retroperitoneal spaces bilaterally. The round ligaments were transected. The SLN mapping was performed in bilateral pelvic basins. After identifying the ureters, the para rectal and paravesical spaces were opened up entirely with careful dissection below the level of the ureters bilaterally and to the depth of the uterine artery origin in order to skeletonize the uterine "web" and ensure visualization of all parametrial channels. The para-aortic basins were carefully exposed and evaluated for isolated para-aortic SLN's. Lymphatic channels were identified travelling to the following visualized sentinel lymph node's: right obturator and left external iliac SLN. These SLNs were separated from their surrounding lymphatic tissue, removed and sent for permanent pathology.  The hysterectomy was started.  The ureter was again noted to be on the medial leaf of the broad ligament.  The peritoneum above the ureter was incised and stretched and the infundibulopelvic ligament was skeletonized, cauterized and cut.  The posterior peritoneum was taken down to the level of the KOH ring.  The anterior peritoneum was also taken down.  The bladder flap was created to the level of the KOH ring.  The uterine artery on the right side was skeletonized, cauterized and cut in the normal manner.  A similar procedure was performed on the left.  The colpotomy was made and the uterus, cervix, bilateral ovaries and tubes were amputated and delivered through the vagina.  Pedicles were inspected and excellent hemostasis was achieved.    The colpotomy at the vaginal cuff was closed with 0 Vicryl using a figure of eight stitch at each apex and 0 V-Loc to close the midportion of the cuff in two layers in a running manner.  Irrigation was used and excellent hemostasis was achieved.  Pressure was decreased in the abdomen to 5 mm Hg with excellent  hemostasis maintained. At this point in the procedure was completed.  Robotic instruments were removed under direct visulaization.  The robot was undocked. The fascia at the 12 mm port was closed with 0 Vicryl using a PMI fascial closure device under direct visualization.  The subcuticular tissue was closed with 4-0 Vicryl and the skin was closed with 4-0 Monocryl in a subcuticular manner.  Dermabond was applied.    The vagina was swabbed with minimal bleeding noted. Small right distal vaginal sidewall laceration noted to be bleeding. Running locked 2-0 Vicryl was used to achieve hemostasis. Small oozing from the hymen on the left was cauterized with monopolar electrocautery.  Foley catheter was removed  All sponge, lap and needle counts were correct x  3.   The patient was transferred to the recovery room in stable condition.  Wiley Hanger, MD

## 2023-08-16 ENCOUNTER — Telehealth: Payer: Self-pay | Admitting: *Deleted

## 2023-08-16 ENCOUNTER — Encounter (HOSPITAL_COMMUNITY): Payer: Self-pay | Admitting: Gynecologic Oncology

## 2023-08-16 NOTE — Anesthesia Postprocedure Evaluation (Signed)
 Anesthesia Post Note  Patient: Haley Luna  Procedure(s) Performed: HYSTERECTOMY, TOTAL, ROBOT-ASSISTED, LAPAROSCOPIC, WITH BILATERAL SALPINGO-OOPHORECTOMY (Bilateral: Pelvis) INJECTION, FOR SENTINEL LYMPH NODE IDENTIFICATION (Pelvis) LAPAROSCOPIC TOTAL PELVIC LYMPHADENECTOMY (Pelvis)     Patient location during evaluation: PACU Anesthesia Type: General Level of consciousness: awake and alert Pain management: pain level controlled Vital Signs Assessment: post-procedure vital signs reviewed and stable Respiratory status: spontaneous breathing, nonlabored ventilation, respiratory function stable and patient connected to nasal cannula oxygen Cardiovascular status: blood pressure returned to baseline and stable Postop Assessment: no apparent nausea or vomiting Anesthetic complications: no  No notable events documented.  Last Vitals:  Vitals:   08/15/23 1500 08/15/23 1508  BP: 120/69 130/67  Pulse: 63 (!) 59  Resp: 12 16  Temp:  (!) 36.2 C  SpO2: 97% 98%    Last Pain:  Vitals:   08/15/23 1508  TempSrc:   PainSc: 5    Pain Goal:                   [name] L Sedra Morfin

## 2023-08-16 NOTE — Telephone Encounter (Signed)
 Spoke with Ms. Penix this morning. She states she is eating, drinking and urinating well. She has not had a BM yet and is not passing gas. She is taking senokot as prescribed and encouraged her to drink plenty of water .Pt is complaining of shoulder pains and she is using heating pad on her shoulders. Advised this could be trapped gas from surgery. Encouraged ambulation, hot mint herbal tea, gas-x chewables (over the counter)and continue to use heating pad. Pt states she woke up with  a low grade fever 99 and then took it again after breakfast it was 100. Advised patient to call the office back if her fever becomes 100.4 or greater. . Incisions are dry and intact. She rates her pain 3/10. Her pain is controlled with tylenol  only.     Instructed to call office with any fever, chills, purulent drainage, uncontrolled pain or any other questions or concerns. Patient verbalizes understanding.   Pt aware of post op appointments as well as the office number [phone] and after hours number [phone] to call if she has any questions or concerns

## 2023-08-21 ENCOUNTER — Telehealth: Payer: Self-pay | Admitting: Oncology

## 2023-08-21 ENCOUNTER — Encounter (HOSPITAL_COMMUNITY): Admission: RE | Admit: 2023-08-21 | Source: Ambulatory Visit

## 2023-08-21 ENCOUNTER — Inpatient Hospital Stay: Attending: Psychiatry | Admitting: Gynecologic Oncology

## 2023-08-21 NOTE — Telephone Encounter (Signed)
 Called Haley Luna and let her know that pathology is not back yet from surgery.  Advised we will reschedule her appointment to tomorrow.  She verbalized understanding and agreement.

## 2023-08-22 ENCOUNTER — Encounter: Payer: Self-pay | Admitting: Gynecologic Oncology

## 2023-08-22 ENCOUNTER — Inpatient Hospital Stay: Attending: Psychiatry | Admitting: Gynecologic Oncology

## 2023-08-22 DIAGNOSIS — Z90710 Acquired absence of both cervix and uterus: Secondary | ICD-10-CM

## 2023-08-22 DIAGNOSIS — Z90722 Acquired absence of ovaries, bilateral: Secondary | ICD-10-CM

## 2023-08-22 DIAGNOSIS — Z7189 Other specified counseling: Secondary | ICD-10-CM

## 2023-08-22 DIAGNOSIS — C541 Malignant neoplasm of endometrium: Secondary | ICD-10-CM

## 2023-08-22 DIAGNOSIS — Z9079 Acquired absence of other genital organ(s): Secondary | ICD-10-CM

## 2023-08-22 NOTE — Progress Notes (Signed)
 Gynecologic Oncology Telehealth Note: Gyn-Onc  I connected with Haley Luna on 08/22/23 at  6:00 PM EDT by telephone and verified that I am speaking with the correct person using two identifiers.  I discussed the limitations, risks, security and privacy concerns of performing an evaluation and management service by telemedicine and the availability of in-person appointments. I also discussed with the patient that there may be a patient responsible charge related to this service. The patient expressed understanding and agreed to proceed.  Other persons participating in the visit and their role in the encounter: none.  Patient's location: home Provider's location: [facility]  Reason for Visit: follow-up  Treatment History: Patient was being evaluated by her OB/GYN for postmenopausal bleeding and thickened endometrial stripe. She had undergone an ultrasound 06/27/2023 which showed an endometrial thickness of 35.85 mm. An endometrial biopsy at that time showed strips of inactive endometrial lining. She was subsequently recommended to undergo hysteroscopy D&C with final pathology showing FIGO grade 1 endometrioid adenocarcinoma, suspicious for myometrial invasion.   08/15/23: Robotic-assisted laparoscopic total hysterectomy with bilateral salpingoophorectomy, SLN biopsy bilaterally, repair of vaginal laceration   Interval History: Doing very well. Bowels moving well. Denies urinary symptoms. Denies vaginal bleeding. Mild vaginal itching starting yesterday.   Past Medical/Surgical History: Past Medical History:  Diagnosis Date   Allergy    Headache    migraines   Obesity    Pneumonia 2017   Pre-diabetes    A1C 6.1 as of 07/10/23.    Past Surgical History:  Procedure Laterality Date   BUNIONECTOMY Right    DILATATION & CURRETTAGE/HYSTEROSCOPY WITH RESECTOCOPE N/A 07/29/2023   Procedure: DILATATION & CURETTAGE/HYSTEROSCOPY WITH MYOSURE RESECTION;  Surgeon: Margaretmary Shaver, MD;   Location: [facility] OR;  Service: Gynecology;  Laterality: N/A;  possible myosure   INJECTION, FOR SENTINEL LYMPH NODE IDENTIFICATION N/A 08/15/2023   Procedure: INJECTION, FOR SENTINEL LYMPH NODE IDENTIFICATION;  Surgeon: Suzi Essex, MD;  Location: [facility] ORS;  Service: Gynecology;  Laterality: N/A;   LAPAROSCOPIC TOTAL PELVIC LYMPHADENECTOMY N/A 08/15/2023   Procedure: LAPAROSCOPIC TOTAL PELVIC LYMPHADENECTOMY;  Surgeon: Suzi Essex, MD;  Location: [facility] ORS;  Service: Gynecology;  Laterality: N/A;   LYMPH NODE BIOPSY     age 68, benign    breast   ROBOTIC ASSISTED TOTAL HYSTERECTOMY WITH BILATERAL SALPINGO OOPHERECTOMY Bilateral 08/15/2023   Procedure: HYSTERECTOMY, TOTAL, ROBOT-ASSISTED, LAPAROSCOPIC, WITH BILATERAL SALPINGO-OOPHORECTOMY;  Surgeon: Suzi Essex, MD;  Location: [facility]S;  Service: Gynecology;  Laterality: Bilateral;   TONSILLECTOMY     age 68    Family History  Problem Relation Age of Onset   Heart disease Mother    Heart attack Mother 52   Miscarriages / [place] Mother    Hearing loss Father    Cancer Sister        GTN   Depression Maternal Grandmother    Diabetes Paternal Grandmother    Mental illness Paternal Grandmother    Alcohol abuse Paternal Grandfather    Breast cancer Neg Hx    Endometrial cancer Neg Hx    Ovarian cancer Neg Hx    Hypertension Neg Hx    Prostate cancer Neg Hx    Colon cancer Neg Hx    Pancreatic cancer Neg Hx     Social History   Socioeconomic History   Marital status: Widowed    Spouse name: Not on file   Number of children: Not on file   Years of education: Not on file   Highest education level:  Not on file  Occupational History   Not on file  Tobacco Use   Smoking status: Never   Smokeless tobacco: Never  Vaping Use   Vaping status: Never Used  Substance and Sexual Activity   Alcohol use: Yes    Comment: occ   Drug use: No   Sexual activity: Not Currently    Birth control/protection: Post-menopausal  Other  Topics Concern   Not on file  Social History Narrative   Not on file   Social Drivers of Health   Financial Resource Strain: Not on file  Food Insecurity: No Food Insecurity (08/02/2023)   Hunger Vital Sign    Worried About Running Out of Food in the Last Year: Never true    Ran Out of Food in the Last Year: Never true  Transportation Needs: No Transportation Needs (08/02/2023)   PRAPARE - Administrator, Civil Service (Medical): No    Lack of Transportation (Non-Medical): No  Physical Activity: Not on file  Stress: Not on file  Social Connections: Unknown (08/18/2021)   Received from [facility]   Social Network    Social Network: Not on file    Current Medications:  Current Outpatient Medications:    acetaminophen  (TYLENOL ) 500 MG tablet, Take 500-1,000 mg by mouth every 6 (six) hours as needed (pain.)., Disp: , Rfl:    COENZYME Q10 PO, Take 1 capsule by mouth daily., Disp: , Rfl:    Omega-3 Fatty Acids (FISH OIL PO), Take 1 capsule by mouth at bedtime. , Disp: , Rfl:    oxyCODONE  (OXY IR/ROXICODONE ) 5 MG immediate release tablet, Take 1 tablet (5 mg total) by mouth every 4 (four) hours as needed for severe pain (pain score 7-10). For AFTER surgery only, do not take and drive (Patient not taking: Reported on 08/09/2023), Disp: 10 tablet, Rfl: 0   senna-docusate (SENOKOT-S) 8.6-50 MG tablet, Take 2 tablets by mouth at bedtime. For AFTER surgery, do not take if having diarrhea (Patient not taking: Reported on 08/09/2023), Disp: 30 tablet, Rfl: 0  Review of Symptoms: Pertinent positives as per HPI.  Physical Exam: Deferred given limitations of phone visit.  Laboratory & Radiologic Studies: A. LYMPH NODE, RIGHT SENTINEL OBTURATOR, LYMPHADENECTOMY:  - Lymph node, negative for carcinoma (0/1)   B. LYMPH NODE, LEFT SENTINEL EXTERNAL ILIAC, LYMPHADENECTOMY:  - Lymph node, negative for carcinoma (0/1)   C. UTERUS, CERVIX, BILATERAL TUBES AND OVARIES,  HYSTERECTOMY:  - Endometrioid carcinoma, FIGO grade 1, 6.8 cm  - Carcinoma invades for a depth of 15 mm where myometrial thickness is  22 mm  - No evidence of lymphovascular invasion  - Benign unremarkable cervix  - Benign unremarkable bilateral ovaries and fallopian tubes  - See oncology table   Assessment & Plan: Haley Luna is a 68 y.o. 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 No LVI. MMRp.  Doing well, meeting milestones.  Discussed continued expectations and restrictions.  Discussed recommendation for vaginal brachytherapy.  Patient amenable.  After we discussed her case at tumor board, I will ask Mariah Shines to reach out to get her scheduled to see Dr. Eloise Hake.  I discussed the assessment and treatment plan with the patient. The patient was provided with an opportunity to ask questions and all were answered. The patient agreed with the plan and demonstrated an understanding of the instructions.   The patient was advised to call back or see an in-person evaluation if the  symptoms worsen or if the condition fails to improve as anticipated.   10 minutes of total time was spent for this patient encounter, including preparation, phone counseling with the patient and coordination of care, and documentation of the encounter.   Wiley Hanger, MD  Division of Gynecologic Oncology  Department of Obstetrics and Gynecology  [facility]

## 2023-08-26 ENCOUNTER — Telehealth: Payer: Self-pay

## 2023-08-26 ENCOUNTER — Other Ambulatory Visit: Payer: Self-pay | Admitting: Oncology

## 2023-08-26 ENCOUNTER — Inpatient Hospital Stay: Admitting: Licensed Clinical Social Worker

## 2023-08-26 DIAGNOSIS — C541 Malignant neoplasm of endometrium: Secondary | ICD-10-CM

## 2023-08-26 LAB — SURGICAL PATHOLOGY

## 2023-08-26 NOTE — Telephone Encounter (Signed)
 S/P Robotic-assisted laparoscopic total hysterectomy with bilateral salpingoophorectomy, SLN biopsy bilaterally, repair of vaginal laceration on 5/8 with Dr.Tucker  Haley Luna states she had some cramping/heaviness in her low abdomen yesterday. Pain 3/10 on pain scale. This morning she woke up with light red bleeding on toilet tissue and on her pad,thinks she saw a sm.clot. Reports no pain today. Reports no fever/chills, eating and drinking fine, positive BM. No heavy lifting or anything inserted in the vagina. No UTI S&S (pain,pressure, burning, frequency)  Aware I will send message to provider and will call back with advice and recommendations

## 2023-08-26 NOTE — Progress Notes (Signed)
 [facility] Clinical Social Work  Initial Assessment   Haley Luna is a 68 y.o. year old female contacted by phone. Clinical Social Work was referred by new patient protocol for assessment of psychosocial needs.   SDOH (Social Determinants of Health) assessments performed: Yes SDOH Interventions    Flowsheet Row Office Visit from 08/05/2023 in [facility] Of [place] Office Visit from 07/10/2023 in [facility] Office Visit from 09/14/2021 in [facility]  SDOH Interventions     Food Insecurity Interventions Intervention Not Indicated -- --  Housing Interventions Intervention Not Indicated -- --  Transportation Interventions Intervention Not Indicated -- --  Utilities Interventions Intervention Not Indicated -- --  Depression Interventions/Treatment  -- UJW1-1 Score <4 Follow-up Not Indicated PHQ2-9 Score <4 Follow-up Not Indicated       SDOH Screenings   Food Insecurity: No Food Insecurity (08/02/2023)  Housing: Unknown (08/02/2023)  Transportation Needs: No Transportation Needs (08/02/2023)  Utilities: Not At Risk (08/02/2023)  Depression (PHQ2-9): Low Risk  (07/10/2023)  Social Connections: Unknown (08/18/2021)   Received from [facility]  Tobacco Use: Low Risk  (08/22/2023)     Distress Screen completed: No     No data to display            Family/Social Information:  Housing Arrangement: patient lives alone. She is widowed (husband died last year). 2 adult children Family members/support persons in your life? 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 Is currently on leave since surgery, but planning to return to [place] work on Thursday . Has EAP benefit if  needed Income source: Employment Financial concerns: No Type of concern: None Food access concerns: no, friends have also been bringing her home cooked meals Religious or spiritual practice: Not known Services Currently in place:  [facility]  Coping/ Adjustment to diagnosis: Patient understands treatment plan and what happens next? yes, has already had surgery and is recovering and trying to pace activities and not overdue activity. She is gradually adding back in movement and is relying on her employees and daughter to help with the farm for now Patient reported stressors: adjusting to post-surgery limitations Hopes and/or priorities: hopes to return to work gradually starting Thursday Patient enjoys time with family/ friends Current coping skills/ strengths: Ability for insight , Active sense of humor , Capable of independent living , Manufacturing systems engineer , Motivation for treatment/growth , and Supportive family/friends     SUMMARY: Current SDOH Barriers:  No major barriers identified today  Clinical Social Work Clinical Goal(s):  No clinical social work goals at this time  Interventions: Discussed common feeling and emotions when being diagnosed with cancer, and the importance of support during treatment Informed patient of the support team roles and support services at [facility] Encouraged patient to call with any questions or concerns   Follow Up Plan: Patient will contact CSW with any support or resource needs Patient verbalizes understanding of plan: Yes    [place] E Nazyia Gaugh, LCSW Clinical Social Worker [facility]

## 2023-08-26 NOTE — Telephone Encounter (Signed)
 Pt is aware of advice/recommendations from Vira Grieves NP. Pt reports she has had no other bleeding and will monitor S&S and call with concerns.

## 2023-08-26 NOTE — Progress Notes (Signed)
 Gynecologic Oncology Multi-Disciplinary Disposition Conference Note  Date of the Conference: 08/26/2023  Patient Name: Haley Luna  Referring Provider: Dr. Sharol Decamp Primary GYN Oncologist: Dr. Orvil Bland   Stage/Disposition:  Stage IB, grade 1 endometrioid endometrial adenocarcinoma. Disposition is to vaginal brachytherapy.   This Multidisciplinary conference took place involving physicians from Gynecologic Oncology, Medical Oncology, Radiation Oncology, Pathology, Radiology along with the Gynecologic Oncology Nurse Practitioner and Gynecologic Oncology Nurse Navigator.  Comprehensive assessment of the patient's malignancy, staging, need for surgery, chemotherapy, radiation therapy, and need for further testing were reviewed. Supportive measures, both inpatient and following discharge were also discussed. The recommended plan of care is documented. Greater than 35 minutes were spent correlating and coordinating this patient's care.

## 2023-08-27 ENCOUNTER — Telehealth: Payer: Self-pay

## 2023-08-27 ENCOUNTER — Encounter: Payer: Self-pay | Admitting: Gynecologic Oncology

## 2023-08-27 NOTE — Progress Notes (Signed)
 GYN Location of Tumor / Histology: Endometrial  Haley Luna presented with symptoms of: bleeding  Biopsies revealed:    Past/Anticipated interventions by Gyn/Onc surgery, if any:    Past/Anticipated interventions by medical oncology, if any:   Weight changes, if any: yes reports gradual weight loss.   Bowel/Bladder complaints, if any: Yes.   burning, and urgency. UA collected in our office per Dr. Orvil Bland.   Nausea/Vomiting, if any: No  Pain issues, if any:  no  SAFETY ISSUES: Prior radiation? no Pacemaker/ICD? no Possible current pregnancy? no Is the patient on methotrexate? no  Current Complaints / other details:   BP 116/80   Pulse 89   Temp 98.7 F (37.1 C)   Resp 18   Ht 5\' 6"  (1.676 m)   Wt 183 lb 12.8 oz (83.4 kg)   SpO2 99%   BMI 29.67 kg/m

## 2023-08-27 NOTE — Telephone Encounter (Signed)
 S/P  Robotic-assisted laparoscopic total hysterectomy with bilateral salpingoophorectomy, SLN biopsy bilaterally, repair of vaginal laceration   Haley Luna called stating she would like to work remotely with all restrictions starting 5/22. She will need a note stating it is ok until 6 week date of 6/20 and return to work full time without restrictions on 6/23  Aware message will be sent to provider and I will call back with recommendations.

## 2023-08-27 NOTE — Telephone Encounter (Signed)
 Pt is aware of note being written and ready to return to work remotely with restrictions on 08/29/23.   Note faxed to [employer] per pt's request

## 2023-08-28 ENCOUNTER — Telehealth: Payer: Self-pay

## 2023-08-28 NOTE — Telephone Encounter (Signed)
 Disability/FMLA forms received from [employer] for surgery done by Dr.Tucker on 08/15/23. Requested information provided and faxed back to [employer] along with a return to work remotely with restrictions on 08/29/23.   Pt is aware

## 2023-08-28 NOTE — Progress Notes (Signed)
 Radiation Oncology         [phone]) [phone] ________________________________  Initial outpatient Consultation Note  Name: Haley Luna MRN: [id number]  Date: 08/29/2023  DOB: [birth year]  QM:VHQION, Ted Favor, FNP  Suzi Essex, MD   REFERRING PHYSICIAN: Suzi Essex, MD  DIAGNOSIS: The encounter diagnosis was Endometrial cancer [facility]).   Endometrial cancer FIGO grade 1 (pT1b, pN0, MX); s/p total hysterectomy and bilateral salpingo-oophorectomy with 68% myometrial invasion 2/2 pelvic lymph nodes negative for carcinoma, negative for LVI, MMRp  HISTORY OF PRESENT ILLNESS::Haley Luna is a 68 y.o. female who is seen as a courtesy of Dr. Orvil Bland  for an opinion concerning radiation therapy as part of management for her recently diagnosed endometrial cancer.   The patient presented to her gyn on 06/27/23 with complaints of post menstrual bleeding and thickened endometrial stripe. She underwent an ultrasound showing an endometrial thickness of 35.85 mm. Patient proceeding with an endometrial biopsy at that time showing strips of inactive endometrial lining.   In light of findings, she was referred to Dr. Daisey Dryer on 08/05/23 who recommended to undergo hysteroscopy D&C with final pathology showing FIGO grade 1 endometrioid adenocarcinoma, suspicious for myometrial invasion.    Subsequently, she was then referred to Dr. Orvil Bland and underwent a robot assisted laparoscopic hysterectomy with bilateral salpingo-oophorectomy on 08/15/23. Surgical pathology indicated endometrioid carcinoma, FIGO grade 1 with tumor size of 6.8 cm.  Invasion depth of 15 mm and myometrial invasion thickness of 22 mm. No evidence of lymphovascular invasion. Unremarkable bilateral ovaries and fallopian tubes. All examined lymph nodes are negative for invasive disease.  Patient saw Dr. Orvil Bland on 08/22/2023 for post-operative follow-up. At that time she was doing well overall and meeting milestones. She also shared  the recommendation for vaginal brachytherapy and has kindly referred to us  today to further discuss this.      PREVIOUS RADIATION THERAPY: No  PAST MEDICAL HISTORY:  Past Medical History:  Diagnosis Date   Allergy    Headache    migraines   Obesity    Pneumonia 2017   Pre-diabetes    A1C 6.1 as of 07/10/23.    PAST SURGICAL HISTORY: Past Surgical History:  Procedure Laterality Date   BUNIONECTOMY Right    DILATATION & CURRETTAGE/HYSTEROSCOPY WITH RESECTOCOPE N/A 07/29/2023   Procedure: DILATATION & CURETTAGE/HYSTEROSCOPY WITH MYOSURE RESECTION;  Surgeon: Margaretmary Shaver, MD;  Location: [facility] OR;  Service: Gynecology;  Laterality: N/A;  possible myosure   INJECTION, FOR SENTINEL LYMPH NODE IDENTIFICATION N/A 08/15/2023   Procedure: INJECTION, FOR SENTINEL LYMPH NODE IDENTIFICATION;  Surgeon: Suzi Essex, MD;  Location: [facility]S;  Service: Gynecology;  Laterality: N/A;   LAPAROSCOPIC TOTAL PELVIC LYMPHADENECTOMY N/A 08/15/2023   Procedure: LAPAROSCOPIC TOTAL PELVIC LYMPHADENECTOMY;  Surgeon: Suzi Essex, MD;  Location: [facility];  Service: Gynecology;  Laterality: N/A;   LYMPH NODE BIOPSY     age 3, benign    breast   ROBOTIC ASSISTED TOTAL HYSTERECTOMY WITH BILATERAL SALPINGO OOPHERECTOMY Bilateral 08/15/2023   Procedure: HYSTERECTOMY, TOTAL, ROBOT-ASSISTED, LAPAROSCOPIC, WITH BILATERAL SALPINGO-OOPHORECTOMY;  Surgeon: Suzi Essex, MD;  Location: [facility];  Service: Gynecology;  Laterality: Bilateral;   TONSILLECTOMY     age 81    FAMILY HISTORY:  Family History  Problem Relation Age of Onset   Heart disease Mother    Heart attack Mother 27   Miscarriages / [place] Mother    Hearing loss Father    Cancer Sister  GTN   Depression Maternal Grandmother    Diabetes Paternal Grandmother    Mental illness Paternal Grandmother    Alcohol abuse Paternal Grandfather    Breast cancer Neg Hx    Endometrial cancer Neg Hx    Ovarian cancer Neg Hx     Hypertension Neg Hx    Prostate cancer Neg Hx    Colon cancer Neg Hx    Pancreatic cancer Neg Hx     SOCIAL HISTORY:  Social History   Tobacco Use   Smoking status: Never   Smokeless tobacco: Never  Vaping Use   Vaping status: Never Used  Substance Use Topics   Alcohol use: Yes    Comment: occ   Drug use: No    ALLERGIES:  Allergies  Allergen Reactions   Penicillins Other (See Comments)    Reaction as a child. Told she almost died. Did it involve swelling of the face/tongue/throat, SOB, or low BP? Yes Did it involve sudden or severe rash/hives, skin peeling, or any reaction on the inside of your mouth or nose? Yes Did you need to seek medical attention at a hospital or doctor's office? Yes When did it last happen?       If all above answers are "NO", may proceed with cephalosporin use.    Poison Ivy Extract Hives   Short Ragweed Pollen Ext Hives   Erythromycin Nausea And Vomiting and Rash   Declomycin [Demeclocycline] Rash   Milk (Cow) Diarrhea and Rash    MEDICATIONS:  Current Outpatient Medications  Medication Sig Dispense Refill   acetaminophen  (TYLENOL ) 500 MG tablet Take 500-1,000 mg by mouth every 6 (six) hours as needed (pain.).     senna-docusate (SENOKOT-S) 8.6-50 MG tablet Take 2 tablets by mouth at bedtime. For AFTER surgery, do not take if having diarrhea 30 tablet 0   COENZYME Q10 PO Take 1 capsule by mouth daily.     nitrofurantoin, macrocrystal-monohydrate, (MACROBID) 100 MG capsule Take 1 capsule (100 mg total) by mouth 2 (two) times daily. 10 capsule 0   Omega-3 Fatty Acids (FISH OIL PO) Take 1 capsule by mouth at bedtime.      oxyCODONE  (OXY IR/ROXICODONE ) 5 MG immediate release tablet Take 1 tablet (5 mg total) by mouth every 4 (four) hours as needed for severe pain (pain score 7-10). For AFTER surgery only, do not take and drive (Patient not taking: Reported on 08/09/2023) 10 tablet 0   No current facility-administered medications for this encounter.     REVIEW OF SYSTEMS: Patient reports to be doing well overall today.  She notes dysuria and urinary urgency today. She denies any abdominal pain, abnormal bloating, vaginal bleeding, or changes in her bowel habits.    PHYSICAL EXAM:  height is 5\' 6"  (1.676 m) and weight is 183 lb 12.8 oz (83.4 kg). Her temperature is 98.7 F (37.1 C). Her blood pressure is 116/80 and her pulse is 89. Her respiration is 18 and oxygen saturation is 99%.   General: Alert and oriented, in no acute distress HEENT: Head is normocephalic. Extraocular movements are intact. Oropharynx is clear. Neck: Neck is supple, no palpable cervical or supraclavicular lymphadenopathy. Heart: Regular in rate and rhythm with no murmurs, rubs, or gallops. Chest: Clear to auscultation bilaterally, with no rhonchi, wheezes, or rales. Abdomen: Soft, nontender, nondistended, with no rigidity or guarding.  Multiple well healed surgical incisions from hysterectomy.  Extremities: No cyanosis or edema. Lymphatics: see Neck Exam Skin: No concerning lesions. Musculoskeletal: symmetric strength and  muscle tone throughout. Neurologic: Cranial nerves II through XII are grossly intact. No obvious focalities. Speech is fluent. Coordination is intact. Psychiatric: Judgment and insight are intact. Affect is appropriate. Pelvic examination deferred to simulation and planning day  ECOG = 1  0 - Asymptomatic (Fully active, able to carry on all predisease activities without restriction)  1 - Symptomatic but completely ambulatory (Restricted in physically strenuous activity but ambulatory and able to carry out work of a light or sedentary nature. For example, light housework, office work)  2 - Symptomatic, <50% in bed during the day (Ambulatory and capable of all self care but unable to carry out any work activities. Up and about more than 50% of waking hours)  3 - Symptomatic, >50% in bed, but not bedbound (Capable of only limited self-care,  confined to bed or chair 50% or more of waking hours)  4 - Bedbound (Completely disabled. Cannot carry on any self-care. Totally confined to bed or chair)  5 - Death   Aurea Blossom MM, Creech RH, Tormey DC, et al. [phone]). "Toxicity and response criteria of the [facility]". Am. Hillard Lowes. Oncol. 5 (6): 649-55  LABORATORY DATA:  Lab Results  Component Value Date   WBC 5.6 08/14/2023   HGB 13.5 08/14/2023   HCT 42.6 08/14/2023   MCV 95.1 08/14/2023   PLT 264 08/14/2023   NEUTROABS 2.5 07/10/2023   Lab Results  Component Value Date   NA 133 (L) 08/14/2023   K 4.1 08/14/2023   CL 104 08/14/2023   CO2 22 08/14/2023   GLUCOSE 102 (H) 08/14/2023   BUN 16 08/14/2023   CREATININE 0.82 08/14/2023   CALCIUM 8.7 (L) 08/14/2023      RADIOGRAPHY: MM 3D SCREENING MAMMOGRAM BILATERAL BREAST Result Date: 08/09/2023 CLINICAL DATA:  Screening. EXAM: DIGITAL SCREENING BILATERAL MAMMOGRAM WITH TOMOSYNTHESIS AND CAD TECHNIQUE: Bilateral screening digital craniocaudal and mediolateral oblique mammograms were obtained. Bilateral screening digital breast tomosynthesis was performed. The images were evaluated with computer-aided detection. COMPARISON:  Previous exam(s). ACR Breast Density Category c: The breasts are heterogeneously dense, which may obscure small masses. FINDINGS: There are no findings suspicious for malignancy. IMPRESSION: No mammographic evidence of malignancy. A result letter of this screening mammogram will be mailed directly to the patient. RECOMMENDATION: Screening mammogram in one year. (Code:SM-B-01Y) BI-RADS CATEGORY  1: Negative. Electronically Signed   By: Amanda Jungling M.D.   On: 08/09/2023 14:06      IMPRESSION: Endometrial cancer FIGO grade 1 (pT1b, pN0, MX); s/p total hysterectomy and bilateral salpingo-oophorectomy with 68% myometrial invasion 2/2 pelvic lymph nodes negative for carcinoma, negative for LVI, MMRp  We have reviewed this patient's current work-up.  Surgical pathology reveals FIGO grade 1 with tumor deeply invasive into the myometrium.  Negative for LIV. Given the pathologic findings, she is at an increased risk for vaginal cuff recurrence. Dr. Eloise Hake recommends five fractions of brachytherapy to decrease the risk of locoregional disease recurrence.   Today, I talked to the patient and family about the findings and work-up thus far.  We discussed the natural history of endometrial cancer and general treatment, highlighting the role of radiotherapy in the management.  We discussed the available radiation techniques, and focused on the details of logistics and delivery.  We reviewed the anticipated acute and late sequelae associated with radiation in this setting.  The patient was encouraged to ask questions that I answered to the best of my ability. A patient consent form was discussed and signed.  We retained a copy for our records.  The patient would like to proceed with radiation and will be scheduled for CT simulation.  PLAN: Patient will be scheduled for CT simulation and 5 fractions of vaginal brachytherapy using [id number] as the source 8 weeks post surgery. Of note, patient does have a trip planned from 07/26 - 11/09/2023 that we will coordinate our treatment around.   UA obtained today for urinary symptoms.   60 minutes of total time was spent for this patient encounter, including preparation, face-to-face counseling with the patient and coordination of care, physical exam, and documentation of the encounter.   ------------------------------------------------   Julio Ohm, PA-C   Noralee Beam, PhD, MD   [facility]  Radiation Oncology Direct Dial: [phone]  Fax: [phone] [email or web address]    This document serves as a record of services personally performed by Retta Caster, MD and Julio Ohm, PA-C. It was created on his behalf by Lucky Sable, a trained medical scribe. The creation of this record is based on the scribe's  personal observations and the provider's statements to them. This document has been checked and approved by the attending provider.

## 2023-08-29 ENCOUNTER — Other Ambulatory Visit: Payer: Self-pay

## 2023-08-29 ENCOUNTER — Other Ambulatory Visit: Payer: Self-pay | Admitting: Gynecologic Oncology

## 2023-08-29 ENCOUNTER — Ambulatory Visit
Admission: RE | Admit: 2023-08-29 | Discharge: 2023-08-29 | Disposition: A | Discharge status: Home / Self Care | Source: Ambulatory Visit | Attending: Gynecologic Oncology | Admitting: Gynecologic Oncology

## 2023-08-29 ENCOUNTER — Ambulatory Visit
Admission: RE | Admit: 2023-08-29 | Discharge: 2023-08-29 | Disposition: A | Discharge status: Home / Self Care | Source: Ambulatory Visit | Attending: Radiation Oncology | Admitting: Radiation Oncology

## 2023-08-29 ENCOUNTER — Encounter: Payer: Self-pay | Admitting: Radiation Oncology

## 2023-08-29 ENCOUNTER — Telehealth: Payer: Self-pay

## 2023-08-29 VITALS — BP 116/80 | HR 89 | Temp 98.7°F | Resp 18 | Ht 66.0 in | Wt 183.8 lb

## 2023-08-29 DIAGNOSIS — N3001 Acute cystitis with hematuria: Secondary | ICD-10-CM

## 2023-08-29 DIAGNOSIS — C541 Malignant neoplasm of endometrium: Secondary | ICD-10-CM | POA: Insufficient documentation

## 2023-08-29 DIAGNOSIS — N3000 Acute cystitis without hematuria: Secondary | ICD-10-CM

## 2023-08-29 DIAGNOSIS — Z79899 Other long term (current) drug therapy: Secondary | ICD-10-CM | POA: Diagnosis not present

## 2023-08-29 DIAGNOSIS — Z90710 Acquired absence of both cervix and uterus: Secondary | ICD-10-CM | POA: Insufficient documentation

## 2023-08-29 DIAGNOSIS — Z90722 Acquired absence of ovaries, bilateral: Secondary | ICD-10-CM | POA: Diagnosis not present

## 2023-08-29 LAB — URINALYSIS, COMPLETE (UACMP) WITH MICROSCOPIC
Bilirubin Urine: NEGATIVE
Glucose, UA: NEGATIVE mg/dL
Ketones, ur: NEGATIVE mg/dL
Nitrite: POSITIVE — AB
Protein, ur: NEGATIVE mg/dL
Specific Gravity, Urine: 1.02 (ref 1.005–1.030)
WBC, UA: >50 WBC/hpf (ref 0–5)
pH: 5 (ref 5.0–8.0)

## 2023-08-29 MED ORDER — NITROFURANTOIN MONOHYD MACRO 100 MG PO CAPS: 100.0000 mg | ORAL_CAPSULE | Freq: Two times a day (BID) | ORAL | 0 refills | Status: DC

## 2023-08-29 NOTE — Progress Notes (Signed)
 Macrobid sent in based on symptoms and UA results.

## 2023-08-29 NOTE — Telephone Encounter (Signed)
 I spoke to Mort Ards, Dr.Kinards nurse. She states Dr.Kinard can order the urinalysis while pt is being seen today.   GYN/ONC provider notified

## 2023-08-29 NOTE — Telephone Encounter (Signed)
 Haley Luna called stating she had a phone visit with Dr.Tucker last week. She states at that time, she mentioned possibly getting a UTI. Reports not sleeping last night due to having pain, burning at the beginning of urine flow and urgency all night. Reports no blood noticed, but urine is "very foggy" with no odor. Reports no fever/chills.   She has an appointment today with Dr.Kinard and is wondering should she wait to see him and let him order the urinalysis, or can our office order it?   Aware message will be sent to providers for advice and recommendations

## 2023-08-30 ENCOUNTER — Ambulatory Visit: Payer: Self-pay | Admitting: *Deleted

## 2023-08-30 NOTE — Telephone Encounter (Signed)
-----   Message from Suellyn Emory sent at 08/29/2023  4:10 PM EDT ----- Based on her UA, it looks like she prob has an infection. We can go ahead and start treatment now with the knowledge that if the culture comes back showing a different antibiotic is needed, then we will need to change. ----- Message ----- From: Interface, Lab In [place] Sent: 08/29/2023   4:06 PM EDT To: Suellyn Emory, NP

## 2023-08-30 NOTE — Telephone Encounter (Signed)
 Spoke with Ms. Courtois and relayed message from Vira Grieves, NP that based on her UA, it looks like she probably has an infection. A Rx for Macrobid has been sent in to pharmacy, a culture has been ordered and based on those results a new Rx may have to be sent in. Pt verbalized understanding, states she has started on the Macrobid and thanked the office for calling.

## 2023-08-31 LAB — URINE CULTURE: Culture: >=100000 — AB

## 2023-09-03 ENCOUNTER — Telehealth: Payer: Self-pay | Admitting: *Deleted

## 2023-09-03 DIAGNOSIS — C541 Malignant neoplasm of endometrium: Secondary | ICD-10-CM

## 2023-09-03 NOTE — Telephone Encounter (Signed)
 RETURNED PATIENT'S PHONE CALL, SPOKE WITH PATIENT. ?

## 2023-09-03 NOTE — Telephone Encounter (Signed)
 Spoke with  Haley Luna and relayed message from Vira Grieves, NP -The urine culture returned showing an infection. It looks like the macrobid prescribed should treat the infection. Pt states her symptoms have improved, her urine is no longer cloudy, she denies fever, chills and or pain. Pt will continue to monitor and call the office with any concerns or questions.

## 2023-09-03 NOTE — Telephone Encounter (Signed)
-----   Message from Suellyn Emory sent at 09/03/2023  7:37 AM EDT ----- Please call her and let her know the culture returned showing an infection. It looks like the macrobid prescribed should treat the infection. How are her symptoms? ----- Message ----- From: Dannis Dy, Lab In [place] Sent: 08/29/2023   4:06 PM EDT To: Suellyn Emory, NP

## 2023-09-13 ENCOUNTER — Encounter: Payer: Self-pay | Admitting: Gynecologic Oncology

## 2023-09-13 ENCOUNTER — Inpatient Hospital Stay: Attending: Psychiatry | Admitting: Gynecologic Oncology

## 2023-09-13 VITALS — BP 129/73 | HR 76 | Temp 97.2°F | Resp 20 | Wt 183.6 lb

## 2023-09-13 DIAGNOSIS — Z7189 Other specified counseling: Secondary | ICD-10-CM

## 2023-09-13 DIAGNOSIS — C541 Malignant neoplasm of endometrium: Secondary | ICD-10-CM

## 2023-09-13 NOTE — Progress Notes (Signed)
 Gynecologic Oncology Return Clinic Visit  09/13/23  Reason for Visit: follow-up, treatment planning  Treatment History: Patient was being evaluated by her OB/GYN for postmenopausal bleeding and thickened endometrial stripe. She had undergone an ultrasound 06/27/2023 which showed an endometrial thickness of 35.85 mm. An endometrial biopsy at that time showed strips of inactive endometrial lining. She was subsequently recommended to undergo hysteroscopy D&C with final pathology showing FIGO grade 1 endometrioid adenocarcinoma, suspicious for myometrial invasion.    08/15/23: Robotic-assisted laparoscopic total hysterectomy with bilateral salpingoophorectomy, SLN biopsy bilaterally, repair of vaginal laceration  Interval History: Doing well.  Feeling much better since treated for her urinary tract infection.  Denies any vaginal bleeding.  Still taking Senokot every few days and MiraLAX as needed with regular bowel function.  Denies any abdominal or pelvic pain.  Past Medical/Surgical History: Past Medical History:  Diagnosis Date   Allergy    Headache    migraines   Obesity    Pneumonia 2017   Pre-diabetes    A1C 6.1 as of 07/10/23.    Past Surgical History:  Procedure Laterality Date   BUNIONECTOMY Right    DILATATION & CURRETTAGE/HYSTEROSCOPY WITH RESECTOCOPE N/A 07/29/2023   Procedure: DILATATION & CURETTAGE/HYSTEROSCOPY WITH MYOSURE RESECTION;  Surgeon: Margaretmary Shaver, MD;  Location: [facility] OR;  Service: Gynecology;  Laterality: N/A;  possible myosure   INJECTION, FOR SENTINEL LYMPH NODE IDENTIFICATION N/A 08/15/2023   Procedure: INJECTION, FOR SENTINEL LYMPH NODE IDENTIFICATION;  Surgeon: Suzi Essex, MD;  Location: [facility];  Service: Gynecology;  Laterality: N/A;   LAPAROSCOPIC TOTAL PELVIC LYMPHADENECTOMY N/A 08/15/2023   Procedure: LAPAROSCOPIC TOTAL PELVIC LYMPHADENECTOMY;  Surgeon: Suzi Essex, MD;  Location: [facility];  Service: Gynecology;  Laterality: N/A;   LYMPH  NODE BIOPSY     age 87, benign    breast   ROBOTIC ASSISTED TOTAL HYSTERECTOMY WITH BILATERAL SALPINGO OOPHERECTOMY Bilateral 08/15/2023   Procedure: HYSTERECTOMY, TOTAL, ROBOT-ASSISTED, LAPAROSCOPIC, WITH BILATERAL SALPINGO-OOPHORECTOMY;  Surgeon: Suzi Essex, MD;  Location: [facility];  Service: Gynecology;  Laterality: Bilateral;   TONSILLECTOMY     age 46    Family History  Problem Relation Age of Onset   Heart disease Mother    Heart attack Mother 56   Miscarriages / [place] Mother    Hearing loss Father    Cancer Sister        GTN   Depression Maternal Grandmother    Diabetes Paternal Grandmother    Mental illness Paternal Grandmother    Alcohol abuse Paternal Grandfather    Breast cancer Neg Hx    Endometrial cancer Neg Hx    Ovarian cancer Neg Hx    Hypertension Neg Hx    Prostate cancer Neg Hx    Colon cancer Neg Hx    Pancreatic cancer Neg Hx     Social History   Socioeconomic History   Marital status: Widowed    Spouse name: Not on file   Number of children: Not on file   Years of education: Not on file   Highest education level: Not on file  Occupational History   Not on file  Tobacco Use   Smoking status: Never   Smokeless tobacco: Never  Vaping Use   Vaping status: Never Used  Substance and Sexual Activity   Alcohol use: Yes    Comment: occ   Drug use: No   Sexual activity: Not Currently    Birth control/protection: Post-menopausal  Other Topics Concern   Not on file  Social History Narrative   Not on file   Social Drivers of Health   Financial Resource Strain: Not on file  Food Insecurity: No Food Insecurity (08/29/2023)   Hunger Vital Sign    Worried About Running Out of Food in the Last Year: Never true    Ran Out of Food in the Last Year: Never true  Transportation Needs: No Transportation Needs (08/02/2023)   PRAPARE - Administrator, Civil Service (Medical): No    Lack of Transportation (Non-Medical): No  Physical  Activity: Not on file  Stress: Not on file  Social Connections: Unknown (08/18/2021)   Received [facility]   Social Network    Social Network: Not on file    Current Medications:  Current Outpatient Medications:    acetaminophen  (TYLENOL ) 500 MG tablet, Take 500-1,000 mg by mouth every 6 (six) hours as needed (pain.)., Disp: , Rfl:    COENZYME Q10 PO, Take 1 capsule by mouth daily., Disp: , Rfl:    Omega-3 Fatty Acids (FISH OIL PO), Take 1 capsule by mouth at bedtime. , Disp: , Rfl:    senna-docusate (SENOKOT-S) 8.6-50 MG tablet, Take 2 tablets by mouth at bedtime. For AFTER surgery, do not take if having diarrhea, Disp: 30 tablet, Rfl: 0  Review of Systems: Denies appetite changes, fevers, chills, fatigue, unexplained weight changes. Denies hearing loss, neck lumps or masses, mouth sores, ringing in ears or voice changes. Denies cough or wheezing.  Denies shortness of breath. Denies chest pain or palpitations. Denies leg swelling. Denies abdominal distention, pain, blood in stools, constipation, diarrhea, nausea, vomiting, or early satiety. Denies pain with intercourse, dysuria, frequency, hematuria or incontinence. Denies hot flashes, pelvic pain, vaginal bleeding or vaginal discharge.   Denies joint pain, back pain or muscle pain/cramps. Denies itching, rash, or wounds. Denies dizziness, headaches, numbness or seizures. Denies swollen lymph nodes or glands, denies easy bruising or bleeding. Denies anxiety, depression, confusion, or decreased concentration.  Physical Exam: BP 129/73   Pulse 76   Temp (!) 97.2 F (36.2 C)   Resp 20   Wt 183 lb 9.6 oz (83.3 kg)   SpO2 99%   BMI 29.63 kg/m  General: Alert, oriented, no acute distress. HEENT: Posterior oropharynx clear, sclera anicteric. Chest: Unlabored breathing on room air. Abdomen: soft, nontender.  Normoactive bowel sounds.  No masses or hepatosplenomegaly appreciated.  Well-healed  incisions. Extremities: Grossly normal range of motion.  Warm, well perfused.  No edema bilaterally. GU: Normal appearing external genitalia without erythema, excoriation, or lesions.  Speculum exam reveals cuff healing well with suture intact.  Suture along the right vaginal sidewall intact, no bleeding or discharge.  Bimanual exam reveals cuff intact, no fluctuance or tenderness with palpation.  Laboratory & Radiologic Studies: A. LYMPH NODE, RIGHT SENTINEL OBTURATOR, LYMPHADENECTOMY:  - Lymph node, negative for carcinoma (0/1)   B. LYMPH NODE, LEFT SENTINEL EXTERNAL ILIAC, LYMPHADENECTOMY:  - Lymph node, negative for carcinoma (0/1)   C. UTERUS, CERVIX, BILATERAL TUBES AND OVARIES, HYSTERECTOMY:  - Endometrioid carcinoma, FIGO grade 1, 6.8 cm  - Carcinoma invades for a depth of 15 mm where myometrial thickness is  22 mm  - No evidence of lymphovascular invasion  - Benign unremarkable cervix  - Benign unremarkable bilateral ovaries and fallopian tubes  - See oncology table   Assessment & Plan: LUCIANA CAMMARATA is a 68 y.o. 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 No LVI.  MMRp. P53 WT.  Patient is overall doing well, meeting postoperative milestones.  Discussed continued expectations and restrictions.  Discussed results again from surgery.  She was given a copy of her pathology report.  Given outer half myometrial invasion and age over 36, we had discussed consideration of vaginal brachytherapy.  She met with Dr. Eloise Hake is scheduled to start VBT in July.  I will plan to see her back in December for follow-up.  Reviewed signs and symptoms that should prompt a phone call between visits.  20 minutes of total time was spent for this patient encounter, including preparation, face-to-face counseling with the patient and coordination of care, and documentation of the encounter.  Wiley Hanger, MD  Division of Gynecologic Oncology   Department of Obstetrics and Gynecology  [facility]

## 2023-09-13 NOTE — Patient Instructions (Signed)
 It was good to see you today.  You are healing very well from surgery!  Please remember, no heavy lifting for 6 weeks after surgery and nothing in the vagina for at least 12 weeks.  We will plan to see you back in December several months after your first follow-up visit with Dr. Eloise Hake.

## 2023-09-16 ENCOUNTER — Encounter: Admitting: Psychiatry

## 2023-09-20 ENCOUNTER — Encounter: Admitting: Gynecologic Oncology

## 2023-09-25 ENCOUNTER — Telehealth: Payer: Self-pay

## 2023-09-25 NOTE — Telephone Encounter (Signed)
 Haley Luna called stating she needs a return to work letter written and sent to [employer].  She is returning full time on 6/23.   Letter written and faxed to [employer]. Pt also aware it is in her Mychart

## 2023-10-10 ENCOUNTER — Telehealth: Payer: Self-pay | Admitting: *Deleted

## 2023-10-10 NOTE — Telephone Encounter (Signed)
 Called patient to remind of [facility]  for 10-14-23, spoke with patient and she is aware of these appts.

## 2023-10-11 NOTE — Progress Notes (Signed)
  Radiation Oncology         [phone] ________________________________  Name: Haley Luna MRN: [id number]  Date: 10/14/2023  DOB: 1955-06-19  Vaginal Brachytherapy Procedure Note  CC: [name] HERO, FNP [name], MD  No diagnosis found.  Diagnosis: The encounter diagnosis was Endometrial cancer (HCC).    Endometrial cancer FIGO grade 1 (pT1b, pN0, MX); s/p total hysterectomy and bilateral salpingo-oophorectomy with 68% myometrial invasion 2/2 pelvic lymph nodes negative for carcinoma, negative for LVI, MMRp  Radiation Treatment Dates: ***  Narrative: She returns today for vaginal cylinder fitting. She was last seen for her initial consultation visit on 08/29/23.   Since her consultation date, she followed up with Dr. Viktoria on 09/13/23. During which time, she was noted to be feeling much better since being treated for her UTI. She was also noted to be meeting all post-operative milestones and denied any concerning symptoms.   No other significant oncologic interval history since the patient was last seen.   ***  ALLERGIES: is allergic to penicillins, poison ivy extract, short ragweed pollen ext, erythromycin, declomycin [demeclocycline], and milk (cow).  Meds: Current Outpatient Medications  Medication Sig Dispense Refill   acetaminophen  (TYLENOL ) 500 MG tablet Take 500-1,000 mg by mouth every 6 (six) hours as needed (pain.).     COENZYME Q10 PO Take 1 capsule by mouth daily.     Omega-3 Fatty Acids (FISH OIL PO) Take 1 capsule by mouth at bedtime.      senna-docusate (SENOKOT-S) 8.6-50 MG tablet Take 2 tablets by mouth at bedtime. For AFTER surgery, do not take if having diarrhea 30 tablet 0   No current facility-administered medications for this encounter.    Physical Findings: The patient is in no acute distress. Patient is alert and oriented.  vitals were not taken for this visit.   No palpable cervical, supraclavicular or axillary  lymphoadenopathy. The heart has a regular rate and rhythm. The lungs are clear to auscultation. Abdomen soft and non-tender.  On pelvic examination the external genitalia were unremarkable. A speculum exam was performed. Vaginal cuff intact, no mucosal lesions. On bimanual exam there were no pelvic masses appreciated.  Lab Findings: Lab Results  Component Value Date   WBC 5.6 08/14/2023   HGB 13.5 08/14/2023   HCT 42.6 08/14/2023   MCV 95.1 08/14/2023   PLT 264 08/14/2023    Radiographic Findings: No results found.  Impression: Endometrial cancer FIGO grade 1 (pT1b, pN0, MX); s/p total hysterectomy and bilateral salpingo-oophorectomy with 68% myometrial invasion 2/2 pelvic lymph nodes negative for carcinoma, negative for LVI, MMRp  Patient was fitted for a vaginal cylinder. The patient will be treated with a *** cm diameter cylinder with a treatment length of *** cm. This distended the vaginal vault without undue discomfort. The patient tolerated the procedure well.  The patient was successfully fitted for a vaginal cylinder. The patient is appropriate to begin vaginal brachytherapy.   Plan: The patient will proceed with CT simulation and vaginal brachytherapy today.    _______________________________   [name], PhD, MD  This document serves as a record of services personally performed by Lynwood Nasuti, MD. It was created on his behalf by Dorthy Fuse, a trained medical scribe. The creation of this record is based on the scribe's personal observations and the provider's statements to them. This document has been checked and approved by the attending provider.

## 2023-10-14 ENCOUNTER — Ambulatory Visit
Admission: RE | Admit: 2023-10-14 | Discharge: 2023-10-14 | Disposition: A | Discharge status: Home / Self Care | Payer: Self-pay | Source: Ambulatory Visit | Attending: Radiation Oncology | Admitting: Radiation Oncology

## 2023-10-14 ENCOUNTER — Ambulatory Visit (HOSPITAL_COMMUNITY)
Admission: RE | Admit: 2023-10-14 | Discharge: 2023-10-14 | Disposition: A | Discharge status: Home / Self Care | Source: Ambulatory Visit | Attending: Radiation Oncology | Admitting: Radiation Oncology

## 2023-10-14 ENCOUNTER — Encounter: Payer: Self-pay | Admitting: Radiation Oncology

## 2023-10-14 ENCOUNTER — Ambulatory Visit
Admission: RE | Admit: 2023-10-14 | Discharge: 2023-10-14 | Disposition: A | Discharge status: Home / Self Care | Source: Ambulatory Visit | Attending: Radiation Oncology | Admitting: Radiation Oncology

## 2023-10-14 ENCOUNTER — Other Ambulatory Visit (HOSPITAL_COMMUNITY): Payer: Self-pay | Admitting: Radiation Oncology

## 2023-10-14 VITALS — BP 132/87 | HR 65 | Temp 97.6°F | Resp 18 | Ht 66.0 in | Wt 182.0 lb

## 2023-10-14 DIAGNOSIS — C541 Malignant neoplasm of endometrium: Secondary | ICD-10-CM

## 2023-10-14 DIAGNOSIS — Z8541 Personal history of malignant neoplasm of cervix uteri: Secondary | ICD-10-CM

## 2023-10-14 NOTE — Progress Notes (Signed)
 Haley Luna is here today for follow up  new vaginal cylinder fitting.     Does the patient complain of any of the following:  Pain:No Abdominal bloating: No Diarrhea/Constipation: No Nausea/Vomiting: No Vaginal Discharge: No Blood in Urine or Stool: No Urinary Issues (dysuria/incomplete emptying/ incontinence/ increased frequency/urgency): No    Additional comments if applicable:  BP 132/87 (BP Location: Right Arm, Patient Position: Sitting, Cuff Size: Large)   Pulse 65   Temp 97.6 F (36.4 C)   Resp 18   Ht 5' 6 (1.676 m)   Wt 182 lb (82.6 kg)   SpO2 99%   BMI 29.38 kg/m

## 2023-10-14 NOTE — Progress Notes (Signed)
  Radiation Oncology         [phone] ________________________________  Name: KYLAH MARESH MRN: [id number]  Date: 10/14/2023  DOB: Nov 04, 1955  CC: [name] HERO, FNP  [name], MD  HDR BRACHYTHERAPY NOTE  DIAGNOSIS: The encounter diagnosis was Endometrial cancer [facility]).    Endometrial cancer FIGO grade 1 (pT1b, pN0, MX); s/p total hysterectomy and bilateral salpingo-oophorectomy with 68% myometrial invasion 2/2 pelvic lymph nodes negative for carcinoma, negative for LVI, MMRp   Simple treatment device note: Patient had construction of her custom vaginal cylinder. She will be treated with a 3.0 cm diameter segmented cylinder. This conforms to her anatomy without undue discomfort.  Vaginal brachytherapy procedure node: The patient was brought to the HDR suite. Identity was confirmed. All relevant records and images related to the planned course of therapy were reviewed. The patient freely provided informed written consent to proceed with treatment after reviewing the details related to the planned course of therapy. The consent form was witnessed and verified by the simulation staff. Then, the patient was set-up in a stable reproducible supine position for radiation therapy. Pelvic exam revealed the vaginal cuff to be intact . The patient's custom vaginal cylinder was placed in the proximal vagina. This was affixed to the CT/MR stabilization plate to prevent slippage. Patient tolerated the placement well.  Verification simulation note:  A fiducial marker was placed within the vaginal cylinder. An AP and lateral film was then obtained through the pelvis area. This documented accurate position of the vaginal cylinder for treatment.  HDR BRACHYTHERAPY TREATMENT  The remote afterloading device was affixed to the vaginal cylinder by catheter. Patient then proceeded to undergo her first high-dose-rate treatment directed at the proximal vagina. The patient was prescribed a dose  of 6.0 gray to be delivered to the mucosal surface. Treatment length was 3.0 cm. Patient was treated with 1 channel using 7 dwell positions. Treatment time was 181.4 seconds. Iridium 192 was the high-dose-rate source for treatment. The patient tolerated the treatment well. After completion of her therapy, a radiation survey was performed documenting return of the iridium source into the GammaMed safe.   PLAN: She will return next week for her second high-dose-rate treatment.  ________________________________  -----------------------------------  [name], PhD, MD  This document serves as a record of services personally performed by Lynwood Nasuti, MD. It was created on his behalf by Dorthy Fuse, a trained medical scribe. The creation of this record is based on the scribe's personal observations and the provider's statements to them. This document has been checked and approved by the attending provider.

## 2023-10-18 ENCOUNTER — Telehealth: Payer: Self-pay | Admitting: *Deleted

## 2023-10-18 NOTE — Telephone Encounter (Signed)
 CALLED PATIENT TO  REMIND OF HDR TX. FOR 10-21-23 @ 10 AM, LVM FOR A RETURN CALL

## 2023-10-18 NOTE — Progress Notes (Signed)
  Radiation Oncology         [phone] ________________________________  Name: Haley Luna MRN: [id number]  Date: 10/21/2023  DOB: 03-16-1956  CC: [name] HERO, FNP  [name], MD  HDR BRACHYTHERAPY NOTE  DIAGNOSIS: The encounter diagnosis was Endometrial cancer [facility]).    Endometrial cancer FIGO grade 1 (pT1b, pN0, MX); s/p total hysterectomy and bilateral salpingo-oophorectomy with 68% myometrial invasion 2/2 pelvic lymph nodes negative for carcinoma, negative for LVI, MMRp   Simple treatment device note: Patient had construction of her custom vaginal cylinder. She will be treated with a 3.0 cm diameter segmented cylinder. This conforms to her anatomy without undue discomfort.  Vaginal brachytherapy procedure node: The patient was brought to the HDR suite. Identity was confirmed. All relevant records and images related to the planned course of therapy were reviewed. The patient freely provided informed written consent to proceed with treatment after reviewing the details related to the planned course of therapy. The consent form was witnessed and verified by the simulation staff. Then, the patient was set-up in a stable reproducible supine position for radiation therapy. Pelvic exam revealed the vaginal cuff to be intact . The patient's custom vaginal cylinder was placed in the proximal vagina. This was affixed to the CT/MR stabilization plate to prevent slippage. Patient tolerated the placement well.  Verification simulation note:  A fiducial marker was placed within the vaginal cylinder. An AP and lateral film was then obtained through the pelvis area. This documented accurate position of the vaginal cylinder for treatment.  HDR BRACHYTHERAPY TREATMENT  The remote afterloading device was affixed to the vaginal cylinder by catheter. Patient then proceeded to undergo her second high-dose-rate treatment directed at the proximal vagina. The patient was prescribed a dose  of 6.0 gray to be delivered to the mucosal surface. Treatment length was 3.0 cm. Patient was treated with 1 channel using 7 dwell positions. Treatment time was 193.7 seconds. Iridium 192 was the high-dose-rate source for treatment. The patient tolerated the treatment well. After completion of her therapy, a radiation survey was performed documenting return of the iridium source into the GammaMed safe.   PLAN: She will return next week for her third high-dose-rate treatment.  After her first treatment last week she did experience a limited course of diarrhea which did not require any medication. ________________________________  -----------------------------------  [name], PhD, MD  This document serves as a record of services personally performed by Lynwood Nasuti, MD. It was created on his behalf by Dorthy Fuse, a trained medical scribe. The creation of this record is based on the scribe's personal observations and the provider's statements to them. This document has been checked and approved by the attending provider.

## 2023-10-21 ENCOUNTER — Other Ambulatory Visit: Payer: Self-pay

## 2023-10-21 ENCOUNTER — Ambulatory Visit
Admission: RE | Admit: 2023-10-21 | Discharge: 2023-10-21 | Disposition: A | Discharge status: Home / Self Care | Source: Ambulatory Visit | Attending: Radiation Oncology | Admitting: Radiation Oncology

## 2023-10-21 DIAGNOSIS — C541 Malignant neoplasm of endometrium: Secondary | ICD-10-CM

## 2023-10-21 LAB — RAD ONC ARIA SESSION SUMMARY
Course Elapsed Days: 0
Plan Fractions Treated to Date: 1
Plan Prescribed Dose Per Fraction: 6 Gy
Plan Total Fractions Prescribed: 5
Plan Total Prescribed Dose: 30 Gy
Reference Point Dosage Given to Date: 5.7799 Gy
Reference Point Dosage Given to Date: 6 Gy
Reference Point Dosage Given to Date: 6.1724 Gy
Reference Point Dosage Given to Date: 6.1728 Gy
Reference Point Session Dosage Given: 5.7799 Gy
Reference Point Session Dosage Given: 6 Gy
Reference Point Session Dosage Given: 6.1724 Gy
Reference Point Session Dosage Given: 6.1728 Gy
Session Number: 2

## 2023-10-22 ENCOUNTER — Other Ambulatory Visit: Payer: Self-pay

## 2023-10-22 LAB — RAD ONC ARIA SESSION SUMMARY
Course Elapsed Days: 7
Course Elapsed Days: 7
Plan Fractions Treated to Date: 2
Plan Fractions Treated to Date: 2
Plan Prescribed Dose Per Fraction: 6 Gy
Plan Prescribed Dose Per Fraction: 6 Gy
Plan Total Fractions Prescribed: 5
Plan Total Fractions Prescribed: 5
Plan Total Prescribed Dose: 30 Gy
Plan Total Prescribed Dose: 30 Gy
Reference Point Dosage Given to Date: 11.5599 Gy
Reference Point Dosage Given to Date: 11.5599 Gy
Reference Point Dosage Given to Date: 12 Gy
Reference Point Dosage Given to Date: 12 Gy
Reference Point Dosage Given to Date: 12.3448 Gy
Reference Point Dosage Given to Date: 12.3448 Gy
Reference Point Dosage Given to Date: 12.3457 Gy
Reference Point Dosage Given to Date: 12.3457 Gy
Reference Point Session Dosage Given: 5.7799 Gy
Reference Point Session Dosage Given: 5.7799 Gy
Reference Point Session Dosage Given: 6 Gy
Reference Point Session Dosage Given: 6 Gy
Reference Point Session Dosage Given: 6.1724 Gy
Reference Point Session Dosage Given: 6.1724 Gy
Reference Point Session Dosage Given: 6.1728 Gy
Reference Point Session Dosage Given: 6.1728 Gy
Session Number: 3
Session Number: 4

## 2023-10-25 ENCOUNTER — Telehealth: Payer: Self-pay | Admitting: *Deleted

## 2023-10-25 NOTE — Telephone Encounter (Signed)
 CALLED PATIENT TO REMIND OF HDR TX. FOR 10-28-23 @ 2 PM, SPOKE WITH PATIENT AND SHE IS AWARE OF THIS TX.

## 2023-10-28 ENCOUNTER — Ambulatory Visit
Admission: RE | Admit: 2023-10-28 | Discharge: 2023-10-28 | Disposition: A | Discharge status: Home / Self Care | Source: Ambulatory Visit | Attending: Radiation Oncology | Admitting: Radiation Oncology

## 2023-10-28 ENCOUNTER — Other Ambulatory Visit: Payer: Self-pay

## 2023-10-28 DIAGNOSIS — C541 Malignant neoplasm of endometrium: Secondary | ICD-10-CM

## 2023-10-28 LAB — RAD ONC ARIA SESSION SUMMARY
Course Elapsed Days: 14
Plan Fractions Treated to Date: 3
Plan Prescribed Dose Per Fraction: 6 Gy
Plan Total Fractions Prescribed: 5
Plan Total Prescribed Dose: 30 Gy
Reference Point Dosage Given to Date: 17.3398 Gy
Reference Point Dosage Given to Date: 18 Gy
Reference Point Dosage Given to Date: 18.5172 Gy
Reference Point Dosage Given to Date: 18.5185 Gy
Reference Point Session Dosage Given: 11.5599 Gy
Reference Point Session Dosage Given: 12 Gy
Reference Point Session Dosage Given: 12.3448 Gy
Reference Point Session Dosage Given: 12.3457 Gy
Session Number: 5

## 2023-10-28 NOTE — Progress Notes (Signed)
  Radiation Oncology         [phone]) [phone] ________________________________  Name: ERABELLA KUIPERS MRN: [id number]  Date: 10/28/2023  DOB: 1956/02/09  CC: [name] HERO, FNP  [name], MD  HDR BRACHYTHERAPY NOTE  DIAGNOSIS: The encounter diagnosis was Endometrial cancer [facility]).    Endometrial cancer FIGO grade 1 (pT1b, pN0, MX); s/p total hysterectomy and bilateral salpingo-oophorectomy with 68% myometrial invasion 2/2 pelvic lymph nodes negative for carcinoma, negative for LVI, MMRp   Simple treatment device note: Patient had construction of her custom vaginal cylinder. She will be treated with a 3.0 cm diameter segmented cylinder. This conforms to her anatomy without undue discomfort.  Vaginal brachytherapy procedure node: The patient was brought to the HDR suite. Identity was confirmed. All relevant records and images related to the planned course of therapy were reviewed. The patient freely provided informed written consent to proceed with treatment after reviewing the details related to the planned course of therapy. The consent form was witnessed and verified by the simulation staff. Then, the patient was set-up in a stable reproducible supine position for radiation therapy. Pelvic exam revealed the vaginal cuff to be intact . The patient's custom vaginal cylinder was placed in the proximal vagina. This was affixed to the CT/MR stabilization plate to prevent slippage. Patient tolerated the placement well.  Verification simulation note:  A fiducial marker was placed within the vaginal cylinder. An AP and lateral film was then obtained through the pelvis area. This documented accurate position of the vaginal cylinder for treatment.  HDR BRACHYTHERAPY TREATMENT  The remote afterloading device was affixed to the vaginal cylinder by catheter. Patient then proceeded to undergo her third high-dose-rate treatment directed at the proximal vagina. The patient was prescribed a dose  of 6.0 gray to be delivered to the mucosal surface. Treatment length was 3.0 cm. Patient was treated with 1 channel using 7 dwell positions. Treatment time was 207.0 seconds. Iridium 192 was the high-dose-rate source for treatment. The patient tolerated the treatment well. After completion of her therapy, a radiation survey was performed documenting return of the iridium source into the GammaMed safe.   PLAN: She will return next week for her fourth high-dose-rate treatment.   ________________________________  -----------------------------------  [name], PhD, MD  This document serves as a record of services personally performed by Lynwood Nasuti, MD. It was created on his behalf by Dorthy Fuse, a trained medical scribe. The creation of this record is based on the scribe's personal observations and the provider's statements to them. This document has been checked and approved by the attending provider.

## 2023-11-04 ENCOUNTER — Ambulatory Visit: Admitting: Radiation Oncology

## 2023-11-08 ENCOUNTER — Telehealth: Payer: Self-pay | Admitting: *Deleted

## 2023-11-08 NOTE — Telephone Encounter (Signed)
 CALLED PATIENT TO REMIND OF HDR TX. FOR 11-11-23 @ 10 AM, SPOKE WITH PATIENT AND SHE IS AWARE OF THIS TX.

## 2023-11-09 NOTE — Progress Notes (Signed)
  Radiation Oncology         [phone] ________________________________  Name: Haley Luna MRN: [id number]  Date: 11/11/2023  DOB: 14-Nov-1955  CC: [name] HERO, FNP  [name], MD  HDR BRACHYTHERAPY NOTE  DIAGNOSIS: The encounter diagnosis was Endometrial cancer [facility]).    Endometrial cancer FIGO grade 1 (pT1b, pN0, MX); s/p total hysterectomy and bilateral salpingo-oophorectomy with 68% myometrial invasion 2/2 pelvic lymph nodes negative for carcinoma, negative for LVI, MMRp   Simple treatment device note: Patient had construction of her custom vaginal cylinder. She will be treated with a 3.0 cm diameter segmented cylinder. This conforms to her anatomy without undue discomfort.  Vaginal brachytherapy procedure node: The patient was brought to the HDR suite. Identity was confirmed. All relevant records and images related to the planned course of therapy were reviewed. The patient freely provided informed written consent to proceed with treatment after reviewing the details related to the planned course of therapy. The consent form was witnessed and verified by the simulation staff. Then, the patient was set-up in a stable reproducible supine position for radiation therapy. Pelvic exam revealed the vaginal cuff to be intact . The patient's custom vaginal cylinder was placed in the proximal vagina. This was affixed to the CT/MR stabilization plate to prevent slippage. Patient tolerated the placement well.  Verification simulation note:  A fiducial marker was placed within the vaginal cylinder. An AP and lateral film was then obtained through the pelvis area. This documented accurate position of the vaginal cylinder for treatment.  HDR BRACHYTHERAPY TREATMENT  The remote afterloading device was affixed to the vaginal cylinder by catheter. Patient then proceeded to undergo her fourth high-dose-rate treatment directed at the proximal vagina. The patient was prescribed a dose  of 6.0 gray to be delivered to the mucosal surface. Treatment length was 3.0 cm. Patient was treated with 1 channel using 7 dwell positions. Treatment time was 236.1 seconds. Iridium 192 was the high-dose-rate source for treatment. The patient tolerated the treatment well. After completion of her therapy, a radiation survey was performed documenting return of the iridium source into the GammaMed safe.   PLAN: She will return later this week for her fifth high-dose-rate treatment.   ________________________________  -----------------------------------  [name], PhD, MD  This document serves as a record of services personally performed by Lynwood Nasuti, MD. It was created on his behalf by Dorthy Fuse, a trained medical scribe. The creation of this record is based on the scribe's personal observations and the provider's statements to them. This document has been checked and approved by the attending provider.

## 2023-11-11 ENCOUNTER — Ambulatory Visit
Admission: RE | Admit: 2023-11-11 | Discharge: 2023-11-11 | Disposition: A | Discharge status: Home / Self Care | Source: Ambulatory Visit | Attending: Radiation Oncology | Admitting: Radiation Oncology

## 2023-11-11 ENCOUNTER — Other Ambulatory Visit: Payer: Self-pay

## 2023-11-11 DIAGNOSIS — C541 Malignant neoplasm of endometrium: Secondary | ICD-10-CM | POA: Diagnosis present

## 2023-11-11 LAB — RAD ONC ARIA SESSION SUMMARY
Course Elapsed Days: 28
Plan Fractions Treated to Date: 4
Plan Prescribed Dose Per Fraction: 6 Gy
Plan Total Fractions Prescribed: 5
Plan Total Prescribed Dose: 30 Gy
Reference Point Dosage Given to Date: 23.1197 Gy
Reference Point Dosage Given to Date: 24 Gy
Reference Point Dosage Given to Date: 24.6896 Gy
Reference Point Dosage Given to Date: 24.6913 Gy
Reference Point Session Dosage Given: 5.7799 Gy
Reference Point Session Dosage Given: 6 Gy
Reference Point Session Dosage Given: 6.1724 Gy
Reference Point Session Dosage Given: 6.1728 Gy
Session Number: 6

## 2023-11-13 ENCOUNTER — Telehealth: Payer: Self-pay | Admitting: *Deleted

## 2023-11-13 NOTE — Telephone Encounter (Signed)
 CALLED PATIENT TO REMIND OF HDR TX. FOR 11-14-23 @ 10 AM, SPOKE WITH PATIENT AND SHE IS AWARE OF THIS TX.

## 2023-11-13 NOTE — Progress Notes (Signed)
  Radiation Oncology         (336) [phone] ________________________________  Name: Haley Luna MRN: [id number]  Date: 11/14/2023  DOB: 01/10/1956  CC: [name] HERO, FNP  [name], MD  HDR BRACHYTHERAPY NOTE  DIAGNOSIS: The encounter diagnosis was Endometrial cancer [facility]).    Endometrial cancer FIGO grade 1 (pT1b, pN0, MX); s/p total hysterectomy and bilateral salpingo-oophorectomy with 68% myometrial invasion 2/2 pelvic lymph nodes negative for carcinoma, negative for LVI, MMRp   Simple treatment device note: Patient had construction of her custom vaginal cylinder. She will be treated with a 3.0 cm diameter segmented cylinder. This conforms to her anatomy without undue discomfort.  Vaginal brachytherapy procedure node: The patient was brought to the HDR suite. Identity was confirmed. All relevant records and images related to the planned course of therapy were reviewed. The patient freely provided informed written consent to proceed with treatment after reviewing the details related to the planned course of therapy. The consent form was witnessed and verified by the simulation staff. Then, the patient was set-up in a stable reproducible supine position for radiation therapy. Pelvic exam revealed the vaginal cuff to be intact . The patient's custom vaginal cylinder was placed in the proximal vagina. This was affixed to the CT/MR stabilization plate to prevent slippage. Patient tolerated the placement well.  Verification simulation note:  A fiducial marker was placed within the vaginal cylinder. An AP and lateral film was then obtained through the pelvis area. This documented accurate position of the vaginal cylinder for treatment.  HDR BRACHYTHERAPY TREATMENT  The remote afterloading device was affixed to the vaginal cylinder by catheter. Patient then proceeded to undergo her fifth high-dose-rate treatment directed at the proximal vagina. The patient was prescribed a dose  of 6.0 gray to be delivered to the mucosal surface. Treatment length was 3.0 cm. Patient was treated with 1 channel using 7 dwell positions. Treatment time was 242.8 seconds. Iridium 192 was the high-dose-rate source for treatment. The patient tolerated the treatment well. After completion of her therapy, a radiation survey was performed documenting return of the iridium source into the GammaMed safe.   PLAN: Patient completed her fifth and last high-dose-rate treatment. She will return next month for a follow up.   ________________________________  -----------------------------------  [name], PhD, MD  This document serves as a record of services personally performed by Lynwood Nasuti, MD. It was created on his behalf by [name], a trained medical scribe. The creation of this record is based on the scribe's personal observations and the provider's statements to them. This document has been checked and approved by the attending provider.

## 2023-11-14 ENCOUNTER — Ambulatory Visit
Admission: RE | Admit: 2023-11-14 | Discharge: 2023-11-14 | Disposition: A | Discharge status: Home / Self Care | Source: Ambulatory Visit | Attending: Radiation Oncology | Admitting: Radiation Oncology

## 2023-11-14 DIAGNOSIS — C541 Malignant neoplasm of endometrium: Secondary | ICD-10-CM

## 2023-11-15 NOTE — Radiation Completion Notes (Signed)
 Patient Name: Haley Luna, Haley Luna MRN: [id number] Date of Birth: 30-Aug-1955 Referring Physician: ANNABELLA SEARCH, M.D. Date of Service: 2023-11-15 Radiation Oncologist: Signe Nasuti, M.D. [facility]                             RADIATION ONCOLOGY END OF TREATMENT NOTE     Diagnosis: C54.1 Malignant neoplasm of endometrium Intent: Curative     ==========DELIVERED PLANS==========  First Treatment Date: 2023-10-14 Last Treatment Date: 2023-11-11   Plan Name: [id number] Site: Vagina Technique: HDR Ir-192 Mode: Brachytherapy Dose Per Fraction: 6 Gy Prescribed Dose (Delivered / Prescribed): 30 Gy / 30 Gy Prescribed Fxs (Delivered / Prescribed): 5 / 5     ==========ON TREATMENT VISIT DATES========== 2023-10-14, 2023-10-21, 2023-10-28, 2023-11-11, 2023-11-14     ==========UPCOMING VISITS========== 12/16/2023 [facility]-RADIATION ONC FOLLOW UP 15 Nasuti Agent, MD        ==========APPENDIX - ON TREATMENT VISIT NOTES==========   See weekly On Treatment Notes in Epic for details in the Media tab (listed as Progress notes on the On Treatment Visit Dates listed above).

## 2023-12-04 ENCOUNTER — Encounter: Payer: Self-pay | Admitting: Radiology

## 2023-12-04 NOTE — Progress Notes (Signed)
  Radiation Oncology         [phone] ________________________________  Name: Haley Luna MRN: [id number]  Date of Service: 12/04/2023  DOB: [birth year]  End of Treatment Note   Diagnosis: FIGO grade 1 (pT1b, pN0, MX) Endometrial Cancer Intent: Curative     ==========DELIVERED PLANS==========  First Treatment Date: 2023-10-14 Last Treatment Date: 2023-11-11   Plan Name: [id number] Site: Vagina Technique: HDR Ir-192 Mode: Brachytherapy Dose Per Fraction: 6 Gy Prescribed Dose (Delivered / Prescribed): 30 Gy / 30 Gy Prescribed Fxs (Delivered / Prescribed): 5 / 5     ====================================   The patient tolerated radiation. She tolerated the treatment well without developing any acute side effects.   The patient will return in one month and will continue follow up with Dr. Viktoria as well.      Ronita Due, PA-C

## 2023-12-13 ENCOUNTER — Encounter: Payer: Self-pay | Admitting: Radiation Oncology

## 2023-12-15 NOTE — Progress Notes (Signed)
  Radiation Oncology         [phone] ________________________________  Name: Haley Luna MRN: [id number]  Date: 12/16/2023  DOB: [birth year]  Follow-Up Visit Note  CC: [name] HERO, FNP  [name], MD  No diagnosis found.  Diagnosis:  The encounter diagnosis was Endometrial cancer (HCC).    Endometrial cancer FIGO grade 1 (pT1b, pN0, MX); s/p total hysterectomy and bilateral salpingo-oophorectomy with 68% myometrial invasion 2/2 pelvic lymph nodes negative for carcinoma, negative for LVI, MMRp  Interval Since Last Radiation: 1 month and 4 days   Intent: Curative  Radiation Treatment Dates: First Treatment Date: 2023-10-14 -- Last Treatment Date: 2023-11-11 Site/Dose/Technique/Mode:  Plan Name: [id number] Site: Vagina Technique: HDR Ir-192 Mode: Brachytherapy Dose Per Fraction: 6 Gy Prescribed Dose (Delivered / Prescribed): 30 Gy / 30 Gy Prescribed Fxs (Delivered / Prescribed): 5 / 5  Narrative:  The patient returns today for routine follow-up. She tolerated brachytherapy relatively well overall without any significant side effects.        No significant oncologic interval history since the patient began radiation therapy or since she completed radiation therapy.   ***  Allergies:  is allergic to penicillins, poison ivy extract, short ragweed pollen ext, erythromycin, declomycin [demeclocycline], and milk (cow).  Meds: Current Outpatient Medications  Medication Sig Dispense Refill   acetaminophen  (TYLENOL ) 500 MG tablet Take 500-1,000 mg by mouth every 6 (six) hours as needed (pain.).     COENZYME Q10 PO Take 1 capsule by mouth daily.     diphenhydrAMINE (BENADRYL) 25 mg capsule Take 25 mg by mouth every 6 (six) hours as needed for allergies.     Omega-3 Fatty Acids (FISH OIL PO) Take 1 capsule by mouth at bedtime.      senna-docusate (SENOKOT-S) 8.6-50 MG tablet Take 2 tablets by mouth at bedtime. For AFTER surgery, do not take if having  diarrhea 30 tablet 0   No current facility-administered medications for this encounter.    Physical Findings: The patient is in no acute distress. Patient is alert and oriented.  vitals were not taken for this visit. .  No significant changes. Lungs are clear to auscultation bilaterally. Heart has regular rate and rhythm. No palpable cervical, supraclavicular, or axillary adenopathy. Abdomen soft, non-tender, normal bowel sounds.  ***  Lab Findings: Lab Results  Component Value Date   WBC 5.6 08/14/2023   HGB 13.5 08/14/2023   HCT 42.6 08/14/2023   MCV 95.1 08/14/2023   PLT 264 08/14/2023    Radiographic Findings: No results found.  Impression: Endometrial cancer FIGO grade 1 (pT1b, pN0, MX); s/p total hysterectomy and bilateral salpingo-oophorectomy with 68% myometrial invasion 2/2 pelvic lymph nodes negative for carcinoma, negative for LVI, MMRp  The patient is recovering from the effects of radiation.  ***  Plan:  ***   *** minutes of total time was spent for this patient encounter, including preparation, face-to-face counseling with the patient and coordination of care, physical exam, and documentation of the encounter. ____________________________________  [name], PhD, MD  This document serves as a record of services personally performed by Lynwood Nasuti, MD. It was created on his behalf by Dorthy Fuse, a trained medical scribe. The creation of this record is based on the scribe's personal observations and the provider's statements to them. This document has been checked and approved by the attending provider.

## 2023-12-16 ENCOUNTER — Ambulatory Visit
Admission: RE | Admit: 2023-12-16 | Discharge: 2023-12-16 | Disposition: A | Discharge status: Home / Self Care | Payer: Self-pay | Source: Ambulatory Visit | Attending: Radiation Oncology | Admitting: Radiation Oncology

## 2023-12-16 ENCOUNTER — Encounter: Payer: Self-pay | Admitting: Radiation Oncology

## 2023-12-16 VITALS — BP 149/81 | HR 72 | Temp 97.3°F | Resp 18 | Ht 60.0 in | Wt 182.1 lb

## 2023-12-16 DIAGNOSIS — C541 Malignant neoplasm of endometrium: Secondary | ICD-10-CM | POA: Insufficient documentation

## 2023-12-16 NOTE — Progress Notes (Signed)
 Haley Luna is here today for follow up post radiation to the pelvic.  They completed their radiation on: 11/11/23   Does the patient complain of any of the following:  Pain:No Abdominal bloating: Yes  Diarrhea/Constipation: Diarrhea has resolved.  Nausea/Vomiting: No Vaginal Discharge: No Blood in Urine or Stool: No Urinary Issues (dysuria/incomplete emptying/ incontinence/ increased frequency/urgency): No Does patient report using vaginal dilator 2-3 times a week and/or sexually active 2-3 weeks: Patient was given vaginal dilators S and S+ today with instructions and importance of use. Patient voiced understanding.  Post radiation skin changes: No Fatigue: Yes    Additional comments if applicable:  BP (!) 149/81 (BP Location: Left Arm, Patient Position: Sitting)   Pulse 72   Temp (!) 97.3 F (36.3 C) (Temporal)   Resp 18   Ht 5' (1.524 m)   Wt 182 lb 2 oz (82.6 kg)   SpO2 100%   BMI 35.57 kg/m

## 2024-03-19 ENCOUNTER — Inpatient Hospital Stay: Attending: Gynecologic Oncology | Admitting: Gynecologic Oncology

## 2024-03-19 ENCOUNTER — Encounter: Payer: Self-pay | Admitting: Gynecologic Oncology

## 2024-03-19 VITALS — BP 121/78 | HR 78 | Temp 98.6°F | Resp 18 | Wt 178.6 lb

## 2024-03-19 DIAGNOSIS — Z90722 Acquired absence of ovaries, bilateral: Secondary | ICD-10-CM | POA: Insufficient documentation

## 2024-03-19 DIAGNOSIS — Z8542 Personal history of malignant neoplasm of other parts of uterus: Secondary | ICD-10-CM | POA: Diagnosis not present

## 2024-03-19 DIAGNOSIS — Z9079 Acquired absence of other genital organ(s): Secondary | ICD-10-CM | POA: Diagnosis not present

## 2024-03-19 DIAGNOSIS — Z923 Personal history of irradiation: Secondary | ICD-10-CM | POA: Diagnosis not present

## 2024-03-19 DIAGNOSIS — Z90710 Acquired absence of both cervix and uterus: Secondary | ICD-10-CM | POA: Insufficient documentation

## 2024-03-19 DIAGNOSIS — C541 Malignant neoplasm of endometrium: Secondary | ICD-10-CM

## 2024-03-19 NOTE — Progress Notes (Signed)
 Gynecologic Oncology Return Clinic Visit  03/19/2024  Reason for Visit: surveillance   Treatment History: Patient was being evaluated by her OB/GYN for postmenopausal bleeding and thickened endometrial stripe. She had undergone an ultrasound 06/27/2023 which showed an endometrial thickness of 35.85 mm. An endometrial biopsy at that time showed strips of inactive endometrial lining. She was subsequently recommended to undergo hysteroscopy D&C with final pathology showing FIGO grade 1 endometrioid adenocarcinoma, suspicious for myometrial invasion.    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 MI 68%. SLNs negative. Offered obs vs VBT given MI >50% and age 67+.   VBT completed 11/11/2023 Technique: HDR Ir-192 Mode: Brachytherapy Dose Per Fraction: 6 Gy Prescribed Dose (Delivered / Prescribed): 30 Gy / 30 Gy Prescribed Fxs (Delivered / Prescribed): 5 / 5  Interval History: Doing well.  Using vaginal dilator frequently.  Notes some intermittent constipation and then will have days of diarrhea.  This has overall improved since she finished radiation.  Denies any abdominal or pelvic pain.  Denies any vaginal bleeding.  Joined the gym and is doing some strength training.  Has lost about 16 pounds.  Past Medical/Surgical History: Past Medical History:  Diagnosis Date   Allergy    Headache    migraines   History of radiation therapy    Endometrium-10/14/23-11/11/23- Dr. Lynwood Nasuti   Obesity    Pneumonia 2017   Pre-diabetes    A1C 6.1 as of 07/10/23.    Past Surgical History:  Procedure Laterality Date   BUNIONECTOMY Right    DILATATION & CURRETTAGE/HYSTEROSCOPY WITH RESECTOCOPE N/A 07/29/2023   Procedure: DILATATION & CURETTAGE/HYSTEROSCOPY WITH MYOSURE RESECTION;  Surgeon: Danielle Rom, MD;  Location: [facility] OR;   Service: Gynecology;  Laterality: N/A;  possible myosure   INJECTION, FOR SENTINEL LYMPH NODE IDENTIFICATION N/A 08/15/2023   Procedure: INJECTION, FOR SENTINEL LYMPH NODE IDENTIFICATION;  Surgeon: [name], MD;  Location: [facility];  Service: Gynecology;  Laterality: N/A;   LAPAROSCOPIC TOTAL PELVIC LYMPHADENECTOMY N/A 08/15/2023   Procedure: LAPAROSCOPIC TOTAL PELVIC LYMPHADENECTOMY;  Surgeon: [name], MD;  Location: [facility];  Service: Gynecology;  Laterality: N/A;   LYMPH NODE BIOPSY     age 68, benign    breast   ROBOTIC ASSISTED TOTAL HYSTERECTOMY WITH BILATERAL SALPINGO OOPHERECTOMY Bilateral 08/15/2023   Procedure: HYSTERECTOMY, TOTAL, ROBOT-ASSISTED, LAPAROSCOPIC, WITH BILATERAL SALPINGO-OOPHORECTOMY;  Surgeon: [name], MD;  Location: [facility];  Service: Gynecology;  Laterality: Bilateral;   TONSILLECTOMY     age 68    Family History  Problem Relation Age of Onset   Heart disease Mother    Heart attack Mother 24   Miscarriages / Stillbirths Mother    Hearing loss Father    Cancer Sister        GTN   Depression Maternal Grandmother    Diabetes Paternal Grandmother    Mental illness Paternal Grandmother    Alcohol abuse Paternal Grandfather    Breast cancer Neg Hx    Endometrial cancer Neg Hx    Ovarian cancer Neg Hx    Hypertension Neg Hx    Prostate cancer Neg Hx    Colon cancer Neg Hx    Pancreatic cancer Neg Hx     Social History   Socioeconomic History   Marital status: Widowed    Spouse name: Not on file   Number of children: Not on file   Years of  education: Not on file   Highest education level: Not on file  Occupational History   Not on file  Tobacco Use   Smoking status: Never   Smokeless tobacco: Never  Vaping Use   Vaping status: Never Used  Substance and Sexual Activity   Alcohol use: Yes    Comment: occ   Drug use: No   Sexual activity: Not Currently    Birth control/protection: Post-menopausal  Other Topics Concern    Not on file  Social History Narrative   Not on file   Social Drivers of Health   Tobacco Use: Low Risk (09/13/2023)   Patient History    Smoking Tobacco Use: Never    Smokeless Tobacco Use: Never    Passive Exposure: Not on file  Financial Resource Strain: Not on file  Food Insecurity: No Food Insecurity (10/14/2023)   Epic    Worried About Programme Researcher, Broadcasting/film/video in the Last Year: Never true    Ran Out of Food in the Last Year: Never true  Transportation Needs: No Transportation Needs (10/14/2023)   Epic    Lack of Transportation (Medical): No    Lack of Transportation (Non-Medical): No  Physical Activity: Not on file  Stress: Not on file  Social Connections: Unknown (08/18/2021)   [facility]   Social Network    Social Network: Not on file  Depression (PHQ2-9): Low Risk (10/14/2023)   Depression (PHQ2-9)    PHQ-2 Score: 0  Alcohol Screen: Not on file  Housing: Low Risk (10/14/2023)   Epic    Unable to Pay for Housing in the Last Year: No    Number of Times Moved in the Last Year: 0    Homeless in the Last Year: No  Utilities: Not At Risk (10/14/2023)   Epic    Threatened with loss of utilities: No  Health Literacy: Not on file    Current Medications: Current Medications[1]  Review of Systems: + constipation Denies appetite changes, fevers, chills, fatigue, unexplained weight changes. Denies hearing loss, neck lumps or masses, mouth sores, ringing in ears or voice changes. Denies cough or wheezing.  Denies shortness of breath. Denies chest pain or palpitations. Denies leg swelling. Denies abdominal distention, pain, blood in stools, diarrhea, nausea, vomiting, or early satiety. Denies pain with intercourse, dysuria, frequency, hematuria or incontinence. Denies hot flashes, pelvic pain, vaginal bleeding or vaginal discharge.   Denies joint pain, back pain or muscle pain/cramps. Denies itching, rash, or wounds. Denies dizziness, headaches, numbness or  seizures. Denies swollen lymph nodes or glands, denies easy bruising or bleeding. Denies anxiety, depression, confusion, or decreased concentration.  Physical Exam: BP 121/78 (BP Location: Right Arm, Patient Position: Sitting)   Pulse 78   Temp 98.6 F (37 C) (Oral)   Resp 18   Wt 178 lb 9.6 oz (81 kg)   SpO2 99%   BMI 34.88 kg/m  General: Alert, oriented, no acute distress. HEENT: Posterior oropharynx clear, sclera anicteric. Chest: Clear to auscultation bilaterally.  Unlabored breathing on room air. Cardiovascular: Regular rate and rhythm, no murmurs. Abdomen: soft, nontender.  Normoactive bowel sounds.  No masses or hepatosplenomegaly appreciated.  Well-healed incisions. Extremities: Grossly normal range of motion.  Warm, well perfused.  No edema bilaterally. Skin: No rashes or lesions noted. Lymphatics: No cervical, supraclavicular, or inguinal adenopathy. GU: Normal appearing external genitalia without erythema, excoriation, or lesions.  Speculum exam reveals cuff intact, no lesions, mild radiation changes noted.  Bimanual exam reveals cuff intact, no  masses or nodularity.    Laboratory & Radiologic Studies: None new  Assessment & Plan: Haley Luna is a 68 y.o. 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 No LVI. Completed VBT in 11/2023. MMRp. P53 WT.   Patient is doing very well, NED on exam today.  Continued vaginal dilator use recommended and encouraged.  Patient congratulated on weight loss.  For NCCN surveillance recommendations, we will continue with visits alternating every 3 months between my office and radiation oncology. We reviewed signs and symptoms that should prompt a phone call between visits.  20 minutes of total time was spent for this patient encounter, including preparation, face-to-face counseling with the patient and coordination of care, and documentation of the encounter.  Comer Dollar,  MD  Division of Gynecologic Oncology  Department of Obstetrics and Gynecology  [facility]       [1]  Current Outpatient Medications:    acetaminophen  (TYLENOL ) 500 MG tablet, Take 500-1,000 mg by mouth every 6 (six) hours as needed (pain.)., Disp: , Rfl:    COENZYME Q10 PO, Take 1 capsule by mouth daily., Disp: , Rfl:    diphenhydrAMINE (BENADRYL) 25 mg capsule, Take 25 mg by mouth every 6 (six) hours as needed for allergies., Disp: , Rfl:    Omega-3 Fatty Acids (FISH OIL PO), Take 1 capsule by mouth at bedtime. , Disp: , Rfl:    senna-docusate (SENOKOT-S) 8.6-50 MG tablet, Take 2 tablets by mouth at bedtime. For AFTER surgery, do not take if having diarrhea, Disp: 30 tablet, Rfl: 0

## 2024-03-19 NOTE — Patient Instructions (Signed)
 It was good to see you today.  I do not see or feel any evidence of cancer recurrence on your exam.  I will see you for follow-up in 6 months.  As always, if you develop any new and concerning symptoms before your next visit, please call to see me sooner.

## 2024-05-18 ENCOUNTER — Ambulatory Visit: Admitting: Radiation Oncology

## 2024-09-17 ENCOUNTER — Inpatient Hospital Stay: Attending: Gynecologic Oncology | Admitting: Gynecologic Oncology
